# Patient Record
Sex: Male | Born: 1950 | Race: White | Hispanic: No | Marital: Married | Smoking: Former smoker
Health system: Southern US, Community
[De-identification: ages and names within clinical notes are randomized; demographics above are authoritative.]

## PROBLEM LIST (undated history)

## (undated) DIAGNOSIS — G4730 Sleep apnea, unspecified: Secondary | ICD-10-CM

## (undated) DIAGNOSIS — K8020 Calculus of gallbladder without cholecystitis without obstruction: Secondary | ICD-10-CM

## (undated) DIAGNOSIS — E785 Hyperlipidemia, unspecified: Secondary | ICD-10-CM

## (undated) DIAGNOSIS — R748 Abnormal levels of other serum enzymes: Secondary | ICD-10-CM

## (undated) DIAGNOSIS — E119 Type 2 diabetes mellitus without complications: Secondary | ICD-10-CM

## (undated) DIAGNOSIS — I10 Essential (primary) hypertension: Secondary | ICD-10-CM

## (undated) DIAGNOSIS — I2510 Atherosclerotic heart disease of native coronary artery without angina pectoris: Secondary | ICD-10-CM

## (undated) DIAGNOSIS — K219 Gastro-esophageal reflux disease without esophagitis: Secondary | ICD-10-CM

## (undated) HISTORY — DX: Abnormal levels of other serum enzymes: R74.8

## (undated) HISTORY — DX: Type 2 diabetes mellitus without complications: E11.9

## (undated) HISTORY — DX: Sleep apnea, unspecified: G47.30

## (undated) HISTORY — DX: Atherosclerotic heart disease of native coronary artery without angina pectoris: I25.10

## (undated) HISTORY — DX: Gastro-esophageal reflux disease without esophagitis: K21.9

## (undated) HISTORY — DX: Essential (primary) hypertension: I10

## (undated) HISTORY — DX: Hyperlipidemia, unspecified: E78.5

## (undated) HISTORY — PX: HAND SURGERY: SHX662

## (undated) HISTORY — DX: Calculus of gallbladder without cholecystitis without obstruction: K80.20

---

## 2014-06-22 ENCOUNTER — Ambulatory Visit: Payer: Self-pay | Admitting: Gastroenterology

## 2014-09-27 LAB — SURGICAL PATHOLOGY

## 2014-11-29 ENCOUNTER — Ambulatory Visit (INDEPENDENT_AMBULATORY_CARE_PROVIDER_SITE_OTHER): Payer: 59 | Admitting: Family Medicine

## 2014-11-29 ENCOUNTER — Encounter: Payer: Self-pay | Admitting: Family Medicine

## 2014-11-29 VITALS — BP 127/72 | HR 57 | Temp 98.5°F | Ht 73.25 in | Wt 237.0 lb

## 2014-11-29 DIAGNOSIS — E785 Hyperlipidemia, unspecified: Secondary | ICD-10-CM | POA: Insufficient documentation

## 2014-11-29 DIAGNOSIS — R358 Other polyuria: Secondary | ICD-10-CM | POA: Diagnosis not present

## 2014-11-29 DIAGNOSIS — G4762 Sleep related leg cramps: Secondary | ICD-10-CM

## 2014-11-29 DIAGNOSIS — I2510 Atherosclerotic heart disease of native coronary artery without angina pectoris: Secondary | ICD-10-CM | POA: Insufficient documentation

## 2014-11-29 DIAGNOSIS — E119 Type 2 diabetes mellitus without complications: Secondary | ICD-10-CM | POA: Insufficient documentation

## 2014-11-29 DIAGNOSIS — I10 Essential (primary) hypertension: Secondary | ICD-10-CM | POA: Insufficient documentation

## 2014-11-29 DIAGNOSIS — R350 Frequency of micturition: Secondary | ICD-10-CM | POA: Diagnosis not present

## 2014-11-29 LAB — URINALYSIS, ROUTINE W REFLEX MICROSCOPIC
Bilirubin, UA: NEGATIVE
Glucose, UA: NEGATIVE
Ketones, UA: NEGATIVE
Leukocytes, UA: NEGATIVE
Nitrite, UA: NEGATIVE
Protein, UA: NEGATIVE
RBC, UA: NEGATIVE
Specific Gravity, UA: 1.01 (ref 1.005–1.030)
Urobilinogen, Ur: 0.2 mg/dL (ref 0.2–1.0)
pH, UA: 6 (ref 5.0–7.5)

## 2014-11-29 NOTE — Progress Notes (Signed)
   BP 127/72 mmHg  Pulse 57  Temp(Src) 98.5 F (36.9 C)  Ht 6' 1.25" (1.861 m)  Wt 237 lb (107.502 kg)  BMI 31.04 kg/m2  SpO2 99%   Subjective:    Patient ID: Alejandro Ryan, male    DOB: 1950/10/30, 64 y.o.   MRN: [id number]  HPI: Alejandro Ryan is a 64 y.o. male  Chief Complaint  Patient presents with  . Urinary Frequency  . leg cramps  large amounts 2 x an hr or so Does not seem to be drinking more Leg cramps off and on but worse over the last weeks During sleep study had some restless legs Cramps 2-3 times at night wake up from sleep  Was on vacation in [place] last mo and given steriod shots and indocin for back muscle strain  Relevant past medical, surgical, family and social history reviewed and updated as indicated. Interim medical history since our last visit reviewed. Allergies and medications reviewed and updated.  Review of Systems  Constitutional: Negative.   Respiratory: Negative.   Cardiovascular: Negative.     Per HPI unless specifically indicated above     Objective:    BP 127/72 mmHg  Pulse 57  Temp(Src) 98.5 F (36.9 C)  Ht 6' 1.25" (1.861 m)  Wt 237 lb (107.502 kg)  BMI 31.04 kg/m2  SpO2 99%  Wt Readings from Last 3 Encounters:  11/29/14 237 lb (107.502 kg)  10/13/14 239 lb (108.41 kg)    Physical Exam  Constitutional: He is oriented to person, place, and time. He appears well-developed and well-nourished. No distress.  HENT:  Head: Normocephalic and atraumatic.  Right Ear: Hearing normal.  Left Ear: Hearing normal.  Nose: Nose normal.  Eyes: Conjunctivae and lids are normal. Right eye exhibits no discharge. Left eye exhibits no discharge. No scleral icterus.  Cardiovascular: Normal rate, regular rhythm and normal heart sounds.   Pulmonary/Chest: Effort normal and breath sounds normal. No respiratory distress.  Musculoskeletal: Normal range of motion.  Legs normal exam  Neurological: He is alert and oriented to person, place, and time.   Skin: Skin is intact. No rash noted.  Psychiatric: He has a normal mood and affect. His speech is normal and behavior is normal. Judgment and thought content normal. Cognition and memory are normal.        Assessment & Plan:   Problem List Items Addressed This Visit    None    Visit Diagnoses    Nocturnal leg cramps    -  Primary    Relevant Orders    CBC with Differential/Platelet    Vitamin B12    Urinalysis, Routine w reflex microscopic ([facility])    Basic metabolic panel    Ferritin    Frequency of urination and polyuria        for legs and frequency will check BMP, ferritin, CBC, B12,  U/A    Relevant Orders    CBC with Differential/Platelet    Vitamin B12    Urinalysis, Routine w reflex microscopic (not [facility])    Basic metabolic panel    Ferritin        Follow up plan: Return if symptoms worsen or fail to improve, for pending results.

## 2014-11-30 LAB — CBC WITH DIFFERENTIAL/PLATELET
BASOS ABS: 0 10*3/uL (ref 0.0–0.2)
BASOS: 0 %
EOS (ABSOLUTE): 0.1 10*3/uL (ref 0.0–0.4)
EOS: 2 %
HEMATOCRIT: 39.6 % (ref 37.5–51.0)
HEMOGLOBIN: 12.9 g/dL (ref 12.6–17.7)
Immature Grans (Abs): 0 10*3/uL (ref 0.0–0.1)
Immature Granulocytes: 0 %
LYMPHS: 40 %
Lymphocytes Absolute: 1.8 10*3/uL (ref 0.7–3.1)
MCH: 30.6 pg (ref 26.6–33.0)
MCHC: 32.6 g/dL (ref 31.5–35.7)
MCV: 94 fL (ref 79–97)
Monocytes Absolute: 0.6 10*3/uL (ref 0.1–0.9)
Monocytes: 13 %
NEUTROS ABS: 2 10*3/uL (ref 1.4–7.0)
NEUTROS PCT: 45 %
Platelets: 196 10*3/uL (ref 150–379)
RBC: 4.22 x10E6/uL (ref 4.14–5.80)
RDW: 15.2 % (ref 12.3–15.4)
WBC: 4.5 10*3/uL (ref 3.4–10.8)

## 2014-11-30 LAB — BASIC METABOLIC PANEL
BUN/Creatinine Ratio: 21 (ref 10–22)
BUN: 15 mg/dL (ref 8–27)
CHLORIDE: 99 mmol/L (ref 97–108)
CO2: 26 mmol/L (ref 18–29)
Calcium: 9.3 mg/dL (ref 8.6–10.2)
Creatinine, Ser: 0.71 mg/dL — ABNORMAL LOW (ref 0.76–1.27)
GFR calc Af Amer: 117 mL/min/{1.73_m2} (ref >59)
GFR, EST NON AFRICAN AMERICAN: 101 mL/min/{1.73_m2} (ref >59)
GLUCOSE: 92 mg/dL (ref 65–99)
POTASSIUM: 4.8 mmol/L (ref 3.5–5.2)
Sodium: 140 mmol/L (ref 134–144)

## 2014-11-30 LAB — FERRITIN: Ferritin: 573 ng/mL — ABNORMAL HIGH (ref 30–400)

## 2014-11-30 LAB — VITAMIN B12

## 2014-12-01 ENCOUNTER — Telehealth: Payer: Self-pay | Admitting: Family Medicine

## 2014-12-01 DIAGNOSIS — I6529 Occlusion and stenosis of unspecified carotid artery: Secondary | ICD-10-CM

## 2014-12-01 NOTE — Telephone Encounter (Signed)
-----   Message from Lurlean HornsNancy H Wilson, CMA sent at 12/01/2014  5:05 PM EDT ----- Herby AbrahamXray results too

## 2014-12-01 NOTE — Telephone Encounter (Signed)
Cramps better with gateraid Had Panorex by dentist yesterday and calcium seen in carotid arteries Pt no sx  Concerned about possible Carotid art disease  Will make apt at [facility] to review and further check if needed.

## 2015-02-17 ENCOUNTER — Other Ambulatory Visit: Payer: Self-pay | Admitting: Family Medicine

## 2015-04-08 ENCOUNTER — Other Ambulatory Visit: Payer: Self-pay | Admitting: Family Medicine

## 2015-04-18 ENCOUNTER — Encounter: Payer: Self-pay | Admitting: Family Medicine

## 2015-04-18 ENCOUNTER — Ambulatory Visit (INDEPENDENT_AMBULATORY_CARE_PROVIDER_SITE_OTHER): Payer: 59 | Admitting: Family Medicine

## 2015-04-18 VITALS — BP 138/82 | HR 56 | Temp 98.7°F | Ht 72.5 in | Wt 244.0 lb

## 2015-04-18 DIAGNOSIS — I2510 Atherosclerotic heart disease of native coronary artery without angina pectoris: Secondary | ICD-10-CM | POA: Diagnosis not present

## 2015-04-18 DIAGNOSIS — E785 Hyperlipidemia, unspecified: Secondary | ICD-10-CM | POA: Diagnosis not present

## 2015-04-18 DIAGNOSIS — Z0000 Encounter for general adult medical examination without abnormal findings: Secondary | ICD-10-CM | POA: Diagnosis not present

## 2015-04-18 DIAGNOSIS — I10 Essential (primary) hypertension: Secondary | ICD-10-CM | POA: Diagnosis not present

## 2015-04-18 DIAGNOSIS — G4730 Sleep apnea, unspecified: Secondary | ICD-10-CM | POA: Insufficient documentation

## 2015-04-18 DIAGNOSIS — E119 Type 2 diabetes mellitus without complications: Secondary | ICD-10-CM | POA: Diagnosis not present

## 2015-04-18 LAB — MICROALBUMIN, URINE WAIVED
Creatinine, Urine Waived: 100 mg/dL (ref 10–300)
Microalb, Ur Waived: 10 mg/L (ref 0–19)

## 2015-04-18 LAB — URINALYSIS, ROUTINE W REFLEX MICROSCOPIC
Bilirubin, UA: NEGATIVE
Glucose, UA: NEGATIVE
Ketones, UA: NEGATIVE
LEUKOCYTES UA: NEGATIVE
NITRITE UA: NEGATIVE
PH UA: 6 (ref 5.0–7.5)
PROTEIN UA: NEGATIVE
RBC UA: NEGATIVE
SPEC GRAV UA: 1.02 (ref 1.005–1.030)
Urobilinogen, Ur: 0.2 mg/dL (ref 0.2–1.0)

## 2015-04-18 LAB — BAYER DCA HB A1C WAIVED: HB A1C: 6 % (ref <7.0)

## 2015-04-18 MED ORDER — LOVASTATIN 40 MG PO TABS: 40.0000 mg | ORAL_TABLET | Freq: Every day | ORAL | Status: DC

## 2015-04-18 MED ORDER — OLMESARTAN-AMLODIPINE-HCTZ 40-10-25 MG PO TABS: 1.0000 | ORAL_TABLET | Freq: Every day | ORAL | Status: DC

## 2015-04-18 MED ORDER — METFORMIN HCL 500 MG PO TABS: 1000.0000 mg | ORAL_TABLET | Freq: Every day | ORAL | Status: DC

## 2015-04-18 MED ORDER — METOPROLOL SUCCINATE ER 50 MG PO TB24: 50.0000 mg | ORAL_TABLET | Freq: Every day | ORAL | Status: DC

## 2015-04-18 MED ORDER — CLONIDINE HCL 0.1 MG PO TABS
0.1000 mg | ORAL_TABLET | Freq: Two times a day (BID) | ORAL | Status: DC
Start: 2015-04-18 — End: 2016-04-23

## 2015-04-18 NOTE — Progress Notes (Signed)
BP 138/82 mmHg  Pulse 56  Temp(Src) 98.7 F (37.1 C)  Ht 6' 0.5" (1.842 m)  Wt 244 lb (110.678 kg)  BMI 32.62 kg/m2  SpO2 98%   Subjective:    Patient ID: Alejandro Ryan, male    DOB: 01-09-1951, 64 y.o.   MRN: [id number]  HPI: Alejandro Ryan is a 64 y.o. male  Chief Complaint  Patient presents with  . Annual Exam   patient doing well with blood pressure medication no side effects takes faithfully Blood sugar doing well with no low blood sugar spells Has gained 5 pounds over this last 6 months Cholesterol doing well no complaints No chest pain no  chest tightness  Relevant past medical, surgical, family and social history reviewed and updated as indicated. Interim medical history since our last visit reviewed. Allergies and medications reviewed and updated.  Review of Systems  Constitutional: Negative.   HENT: Negative.   Eyes: Negative.   Respiratory: Negative.   Cardiovascular: Negative.   Gastrointestinal: Negative.   Endocrine: Negative.   Genitourinary: Negative.   Musculoskeletal: Negative.   Skin: Negative.   Allergic/Immunologic: Negative.   Neurological: Negative.   Hematological: Negative.   Psychiatric/Behavioral: Negative.     Per HPI unless specifically indicated above     Objective:    BP 138/82 mmHg  Pulse 56  Temp(Src) 98.7 F (37.1 C)  Ht 6' 0.5" (1.842 m)  Wt 244 lb (110.678 kg)  BMI 32.62 kg/m2  SpO2 98%  Wt Readings from Last 3 Encounters:  04/18/15 244 lb (110.678 kg)  11/29/14 237 lb (107.502 kg)  10/13/14 239 lb (108.41 kg)    Physical Exam  Constitutional: He is oriented to person, place, and time. He appears well-developed and well-nourished.  HENT:  Head: Normocephalic and atraumatic.  Right Ear: External ear normal.  Left Ear: External ear normal.  Eyes: Conjunctivae and EOM are normal. Pupils are equal, round, and reactive to light.  Neck: Normal range of motion. Neck supple.  Cardiovascular: Normal rate, regular  rhythm, normal heart sounds and intact distal pulses.   Pulmonary/Chest: Effort normal and breath sounds normal.  Abdominal: Soft. Bowel sounds are normal. There is no splenomegaly or hepatomegaly.  Genitourinary: Rectum normal, prostate normal and penis normal.  Musculoskeletal: Normal range of motion.  Neurological: He is alert and oriented to person, place, and time. He has normal reflexes.  Skin: No rash noted. No erythema.  Psychiatric: He has a normal mood and affect. His behavior is normal. Judgment and thought content normal.    Results for orders placed or performed in visit on 11/29/14  CBC with Differential/Platelet  Result Value Ref Range   WBC 4.5 3.4 - 10.8 x10E3/uL   RBC 4.22 4.14 - 5.80 x10E6/uL   Hemoglobin 12.9 12.6 - 17.7 g/dL   Hematocrit 40.9 81.1 - 51.0 %   MCV 94 79 - 97 fL   MCH 30.6 26.6 - 33.0 pg   MCHC 32.6 31.5 - 35.7 g/dL   RDW 91.4 78.2 - 95.6 %   Platelets 196 150 - 379 x10E3/uL   Neutrophils 45 %   Lymphs 40 %   Monocytes 13 %   Eos 2 %   Basos 0 %   Neutrophils Absolute 2.0 1.4 - 7.0 x10E3/uL   Lymphocytes Absolute 1.8 0.7 - 3.1 x10E3/uL   Monocytes Absolute 0.6 0.1 - 0.9 x10E3/uL   EOS (ABSOLUTE) 0.1 0.0 - 0.4 x10E3/uL   Basophils Absolute 0.0 0.0 - 0.2 x10E3/uL  Immature Granulocytes 0 %   Immature Grans (Abs) 0.0 0.0 - 0.1 x10E3/uL  Vitamin B12  Result Value Ref Range   Vitamin B-12 >2000 (H) 211 - 946 pg/mL  Urinalysis, Routine w reflex microscopic (not at [facility])  Result Value Ref Range   Specific Gravity, UA 1.010 1.005 - 1.030   pH, UA 6.0 5.0 - 7.5   Color, UA Yellow Yellow   Appearance Ur Clear Clear   Leukocytes, UA Negative Negative   Protein, UA Negative Negative/Trace   Glucose, UA Negative Negative   Ketones, UA Negative Negative   RBC, UA Negative Negative   Bilirubin, UA Negative Negative   Urobilinogen, Ur 0.2 0.2 - 1.0 mg/dL   Nitrite, UA Negative Negative  Ferritin  Result Value Ref Range   Ferritin 573 (H) 30 -  400 ng/mL  Basic metabolic panel  Result Value Ref Range   Glucose 92 65 - 99 mg/dL   BUN 15 8 - 27 mg/dL   Creatinine, Ser 1.61 (L) 0.76 - 1.27 mg/dL   GFR calc non Af Amer 101 >59 mL/min/1.73   GFR calc Af Amer 117 >59 mL/min/1.73   BUN/Creatinine Ratio 21 10 - 22   Sodium 140 134 - 144 mmol/L   Potassium 4.8 3.5 - 5.2 mmol/L   Chloride 99 97 - 108 mmol/L   CO2 26 18 - 29 mmol/L   Calcium 9.3 8.6 - 10.2 mg/dL      Assessment & Plan:   Problem List Items Addressed This Visit      Cardiovascular and Mediastinum   Hypertension    The current medical regimen is effective;  continue present plan and medications.       Relevant Medications   cloNIDine (CATAPRES) 0.1 MG tablet   lovastatin (MEVACOR) 40 MG tablet   Olmesartan-Amlodipine-HCTZ (TRIBENZOR) 40-10-25 MG TABS   metoprolol succinate (TOPROL-XL) 50 MG 24 hr tablet   CAD (coronary artery disease)    The current medical regimen is effective;  continue present plan and medications.       Relevant Medications   cloNIDine (CATAPRES) 0.1 MG tablet   lovastatin (MEVACOR) 40 MG tablet   Olmesartan-Amlodipine-HCTZ (TRIBENZOR) 40-10-25 MG TABS   metoprolol succinate (TOPROL-XL) 50 MG 24 hr tablet     Endocrine   Diabetes mellitus without complication (HCC)    The current medical regimen is effective;  continue present plan and medications. Discussed need better control with diet      Relevant Medications   lovastatin (MEVACOR) 40 MG tablet   metFORMIN (GLUCOPHAGE) 500 MG tablet   Olmesartan-Amlodipine-HCTZ (TRIBENZOR) 40-10-25 MG TABS   Other Relevant Orders   Microalbumin, Urine Waived   Bayer DCA Hb A1c Waived     Other   Sleep apnea    Using CPAP      Hyperlipidemia    The current medical regimen is effective;  continue present plan and medications.       Relevant Medications   cloNIDine (CATAPRES) 0.1 MG tablet   lovastatin (MEVACOR) 40 MG tablet   Olmesartan-Amlodipine-HCTZ (TRIBENZOR) 40-10-25 MG  TABS   metoprolol succinate (TOPROL-XL) 50 MG 24 hr tablet    Other Visit Diagnoses    Routine general medical examination at a health care facility    -  Primary    Relevant Orders    CBC with Differential/Platelet    Comprehensive metabolic panel    Lipid Panel w/o Chol/HDL Ratio    PSA    TSH    Urinalysis,  Routine w reflex microscopic (not [facility])        Follow up plan: Return in about 3 months (around 07/19/2015), or if symptoms worsen or fail to improve, for a1c.

## 2015-04-18 NOTE — Assessment & Plan Note (Signed)
The current medical regimen is effective;  continue present plan and medications.  

## 2015-04-18 NOTE — Assessment & Plan Note (Signed)
Using CPAP 

## 2015-04-18 NOTE — Assessment & Plan Note (Signed)
The current medical regimen is effective;  continue present plan and medications. Discussed need better control with diet

## 2015-04-19 ENCOUNTER — Encounter: Payer: Self-pay | Admitting: Family Medicine

## 2015-04-19 LAB — COMPREHENSIVE METABOLIC PANEL
ALK PHOS: 43 IU/L (ref 39–117)
ALT: 25 IU/L (ref 0–44)
AST: 21 IU/L (ref 0–40)
Albumin/Globulin Ratio: 1.6 (ref 1.1–2.5)
Albumin: 4.3 g/dL (ref 3.6–4.8)
BUN/Creatinine Ratio: 19 (ref 10–22)
BUN: 17 mg/dL (ref 8–27)
Bilirubin Total: 0.2 mg/dL (ref 0.0–1.2)
CHLORIDE: 102 mmol/L (ref 97–106)
CO2: 24 mmol/L (ref 18–29)
CREATININE: 0.9 mg/dL (ref 0.76–1.27)
Calcium: 9.5 mg/dL (ref 8.6–10.2)
GFR calc Af Amer: 105 mL/min/{1.73_m2} (ref >59)
GFR calc non Af Amer: 91 mL/min/{1.73_m2} (ref >59)
GLUCOSE: 106 mg/dL — AB (ref 65–99)
Globulin, Total: 2.7 g/dL (ref 1.5–4.5)
Potassium: 4.7 mmol/L (ref 3.5–5.2)
SODIUM: 141 mmol/L (ref 136–144)
Total Protein: 7 g/dL (ref 6.0–8.5)

## 2015-04-19 LAB — LIPID PANEL W/O CHOL/HDL RATIO
CHOLESTEROL TOTAL: 157 mg/dL (ref 100–199)
HDL: 50 mg/dL (ref >39)
LDL Calculated: 75 mg/dL (ref 0–99)
Triglycerides: 160 mg/dL — ABNORMAL HIGH (ref 0–149)
VLDL CHOLESTEROL CAL: 32 mg/dL (ref 5–40)

## 2015-04-19 LAB — CBC WITH DIFFERENTIAL/PLATELET
BASOS ABS: 0 10*3/uL (ref 0.0–0.2)
Basos: 1 %
EOS (ABSOLUTE): 0.1 10*3/uL (ref 0.0–0.4)
Eos: 3 %
Hematocrit: 38.6 % (ref 37.5–51.0)
Hemoglobin: 12.6 g/dL (ref 12.6–17.7)
Immature Grans (Abs): 0 10*3/uL (ref 0.0–0.1)
Immature Granulocytes: 0 %
LYMPHS ABS: 2.1 10*3/uL (ref 0.7–3.1)
Lymphs: 50 %
MCH: 30.8 pg (ref 26.6–33.0)
MCHC: 32.6 g/dL (ref 31.5–35.7)
MCV: 94 fL (ref 79–97)
MONOS ABS: 0.4 10*3/uL (ref 0.1–0.9)
Monocytes: 9 %
Neutrophils Absolute: 1.6 10*3/uL (ref 1.4–7.0)
Neutrophils: 37 %
Platelets: 203 10*3/uL (ref 150–379)
RBC: 4.09 x10E6/uL — AB (ref 4.14–5.80)
RDW: 15.1 % (ref 12.3–15.4)
WBC: 4.3 10*3/uL (ref 3.4–10.8)

## 2015-04-19 LAB — PSA: Prostate Specific Ag, Serum: 1.9 ng/mL (ref 0.0–4.0)

## 2015-04-19 LAB — TSH: TSH: 1.38 u[IU]/mL (ref 0.450–4.500)

## 2015-04-21 ENCOUNTER — Other Ambulatory Visit: Payer: Self-pay | Admitting: Family Medicine

## 2015-04-21 MED ORDER — METFORMIN HCL ER 500 MG PO TB24: 1000.0000 mg | ORAL_TABLET | Freq: Two times a day (BID) | ORAL | Status: DC

## 2015-07-12 ENCOUNTER — Telehealth: Payer: Self-pay

## 2015-07-12 NOTE — Telephone Encounter (Signed)
Requesting new Rx for  Viagra Tab  [facility]

## 2015-07-13 MED ORDER — SILDENAFIL CITRATE 100 MG PO TABS: 100.0000 mg | ORAL_TABLET | Freq: Every day | ORAL | Status: DC | PRN

## 2015-07-21 ENCOUNTER — Ambulatory Visit: Payer: 59 | Admitting: Family Medicine

## 2015-07-27 ENCOUNTER — Encounter: Payer: Self-pay | Admitting: Family Medicine

## 2015-07-27 ENCOUNTER — Ambulatory Visit (INDEPENDENT_AMBULATORY_CARE_PROVIDER_SITE_OTHER): Payer: 59 | Admitting: Family Medicine

## 2015-07-27 VITALS — BP 129/72 | HR 59 | Temp 98.7°F | Ht 72.5 in | Wt 245.0 lb

## 2015-07-27 DIAGNOSIS — E785 Hyperlipidemia, unspecified: Secondary | ICD-10-CM | POA: Diagnosis not present

## 2015-07-27 DIAGNOSIS — I10 Essential (primary) hypertension: Secondary | ICD-10-CM | POA: Diagnosis not present

## 2015-07-27 DIAGNOSIS — E119 Type 2 diabetes mellitus without complications: Secondary | ICD-10-CM

## 2015-07-27 LAB — BAYER DCA HB A1C WAIVED: HB A1C (BAYER DCA - WAIVED): 6.1 % (ref <7.0)

## 2015-07-27 NOTE — Progress Notes (Signed)
BP 129/72 mmHg  Pulse 59  Temp(Src) 98.7 F (37.1 C)  Ht 6' 0.5" (1.842 m)  Wt 245 lb (111.131 kg)  BMI 32.75 kg/m2  SpO2 97%   Subjective:    Patient ID: Alejandro Ryan, male    DOB: 1951-04-22, 65 y.o.   MRN: [id number]  HPI: Alejandro Ryan is a 65 y.o. male  Chief Complaint  Patient presents with  . Diabetes   doing well with diabetes taking metformin 502 tablets at night without problems noted low blood sugar spells no issues Blood pressure doing well and checked at home every now and then is doing well No issues with cholesterol medicines Takes medications faithfully without side effects  Relevant past medical, surgical, family and social history reviewed and updated as indicated. Interim medical history since our last visit reviewed. Allergies and medications reviewed and updated.  Review of Systems  Constitutional: Negative.   Respiratory: Negative.   Cardiovascular: Negative.     Per HPI unless specifically indicated above     Objective:    BP 129/72 mmHg  Pulse 59  Temp(Src) 98.7 F (37.1 C)  Ht 6' 0.5" (1.842 m)  Wt 245 lb (111.131 kg)  BMI 32.75 kg/m2  SpO2 97%  Wt Readings from Last 3 Encounters:  07/27/15 245 lb (111.131 kg)  04/18/15 244 lb (110.678 kg)  11/29/14 237 lb (107.502 kg)    Physical Exam  Constitutional: He is oriented to person, place, and time. He appears well-developed and well-nourished. No distress.  HENT:  Head: Normocephalic and atraumatic.  Right Ear: Hearing normal.  Left Ear: Hearing normal.  Nose: Nose normal.  Eyes: Conjunctivae and lids are normal. Right eye exhibits no discharge. Left eye exhibits no discharge. No scleral icterus.  Cardiovascular: Normal rate, regular rhythm and normal heart sounds.   Pulmonary/Chest: Effort normal and breath sounds normal. No respiratory distress.  Musculoskeletal: Normal range of motion.  Neurological: He is alert and oriented to person, place, and time.  Skin: Skin is intact. No  rash noted.  Psychiatric: He has a normal mood and affect. His speech is normal and behavior is normal. Judgment and thought content normal. Cognition and memory are normal.    Results for orders placed or performed in visit on 04/18/15  CBC with Differential/Platelet  Result Value Ref Range   WBC 4.3 3.4 - 10.8 x10E3/uL   RBC 4.09 (L) 4.14 - 5.80 x10E6/uL   Hemoglobin 12.6 12.6 - 17.7 g/dL   Hematocrit 08.6 57.8 - 51.0 %   MCV 94 79 - 97 fL   MCH 30.8 26.6 - 33.0 pg   MCHC 32.6 31.5 - 35.7 g/dL   RDW 46.9 62.9 - 52.8 %   Platelets 203 150 - 379 x10E3/uL   Neutrophils 37 %   Lymphs 50 %   Monocytes 9 %   Eos 3 %   Basos 1 %   Neutrophils Absolute 1.6 1.4 - 7.0 x10E3/uL   Lymphocytes Absolute 2.1 0.7 - 3.1 x10E3/uL   Monocytes Absolute 0.4 0.1 - 0.9 x10E3/uL   EOS (ABSOLUTE) 0.1 0.0 - 0.4 x10E3/uL   Basophils Absolute 0.0 0.0 - 0.2 x10E3/uL   Immature Granulocytes 0 %   Immature Grans (Abs) 0.0 0.0 - 0.1 x10E3/uL  Comprehensive metabolic panel  Result Value Ref Range   Glucose 106 (H) 65 - 99 mg/dL   BUN 17 8 - 27 mg/dL   Creatinine, Ser 4.13 0.76 - 1.27 mg/dL   GFR calc non Af Amer 91 >  59 mL/min/1.73   GFR calc Af Amer 105 >59 mL/min/1.73   BUN/Creatinine Ratio 19 10 - 22   Sodium 141 136 - 144 mmol/L   Potassium 4.7 3.5 - 5.2 mmol/L   Chloride 102 97 - 106 mmol/L   CO2 24 18 - 29 mmol/L   Calcium 9.5 8.6 - 10.2 mg/dL   Total Protein 7.0 6.0 - 8.5 g/dL   Albumin 4.3 3.6 - 4.8 g/dL   Globulin, Total 2.7 1.5 - 4.5 g/dL   Albumin/Globulin Ratio 1.6 1.1 - 2.5   Bilirubin Total 0.2 0.0 - 1.2 mg/dL   Alkaline Phosphatase 43 39 - 117 IU/L   AST 21 0 - 40 IU/L   ALT 25 0 - 44 IU/L  Lipid Panel w/o Chol/HDL Ratio  Result Value Ref Range   Cholesterol, Total 157 100 - 199 mg/dL   Triglycerides 782 (H) 0 - 149 mg/dL   HDL 50 >95 mg/dL   VLDL Cholesterol Cal 32 5 - 40 mg/dL   LDL Calculated 75 0 - 99 mg/dL  PSA  Result Value Ref Range   Prostate Specific Ag, Serum 1.9  0.0 - 4.0 ng/mL  TSH  Result Value Ref Range   TSH 1.380 0.450 - 4.500 uIU/mL  Urinalysis, Routine w reflex microscopic (not at [facility])  Result Value Ref Range   Specific Gravity, UA 1.020 1.005 - 1.030   pH, UA 6.0 5.0 - 7.5   Color, UA Yellow Yellow   Appearance Ur Clear Clear   Leukocytes, UA Negative Negative   Protein, UA Negative Negative/Trace   Glucose, UA Negative Negative   Ketones, UA Negative Negative   RBC, UA Negative Negative   Bilirubin, UA Negative Negative   Urobilinogen, Ur 0.2 0.2 - 1.0 mg/dL   Nitrite, UA Negative Negative  Microalbumin, Urine Waived  Result Value Ref Range   Microalb, Ur Waived 10 0 - 19 mg/L   Creatinine, Urine Waived 100 10 - 300 mg/dL   Microalb/Creat Ratio <30 <30 mg/g  Bayer DCA Hb A1c Waived  Result Value Ref Range   Bayer DCA Hb A1c Waived 6.0 <7.0 %      Assessment & Plan:   Problem List Items Addressed This Visit      Cardiovascular and Mediastinum   Hypertension    The current medical regimen is effective;  continue present plan and medications.         Endocrine   Diabetes mellitus without complication (HCC) - Primary    The current medical regimen is effective;  continue present plan and medications.       Relevant Orders   Bayer DCA Hb A1c Waived     Other   Hyperlipidemia    The current medical regimen is effective;  continue present plan and medications.           Follow up plan: Return in about 3 months (around 10/24/2015) for Med check with BMP, lipids, ALT, AST, A1c.

## 2015-07-27 NOTE — Assessment & Plan Note (Signed)
The current medical regimen is effective;  continue present plan and medications.  

## 2015-08-17 LAB — HM DIABETES EYE EXAM

## 2015-11-01 ENCOUNTER — Ambulatory Visit (INDEPENDENT_AMBULATORY_CARE_PROVIDER_SITE_OTHER): Payer: 59 | Admitting: Family Medicine

## 2015-11-01 ENCOUNTER — Encounter: Payer: Self-pay | Admitting: Family Medicine

## 2015-11-01 VITALS — BP 137/75 | HR 64 | Temp 98.2°F | Ht 72.5 in | Wt 245.0 lb

## 2015-11-01 DIAGNOSIS — I10 Essential (primary) hypertension: Secondary | ICD-10-CM | POA: Diagnosis not present

## 2015-11-01 DIAGNOSIS — G4730 Sleep apnea, unspecified: Secondary | ICD-10-CM

## 2015-11-01 DIAGNOSIS — I2510 Atherosclerotic heart disease of native coronary artery without angina pectoris: Secondary | ICD-10-CM

## 2015-11-01 DIAGNOSIS — E785 Hyperlipidemia, unspecified: Secondary | ICD-10-CM

## 2015-11-01 DIAGNOSIS — E119 Type 2 diabetes mellitus without complications: Secondary | ICD-10-CM | POA: Diagnosis not present

## 2015-11-01 LAB — LP+ALT+AST PICCOLO, WAIVED
ALT (SGPT) Piccolo, Waived: 30 U/L (ref 10–47)
AST (SGOT) Piccolo, Waived: 34 U/L (ref 11–38)
CHOL/HDL RATIO PICCOLO,WAIVE: 2.8 mg/dL
CHOLESTEROL PICCOLO, WAIVED: 148 mg/dL (ref <200)
HDL CHOL PICCOLO, WAIVED: 52 mg/dL — AB (ref >59)
LDL CHOL CALC PICCOLO WAIVED: 66 mg/dL (ref <100)
Triglycerides Piccolo,Waived: 151 mg/dL — ABNORMAL HIGH (ref <150)
VLDL Chol Calc Piccolo,Waive: 30 mg/dL — ABNORMAL HIGH (ref <30)

## 2015-11-01 LAB — BAYER DCA HB A1C WAIVED: HB A1C (BAYER DCA - WAIVED): 6.4 % (ref <7.0)

## 2015-11-01 NOTE — Progress Notes (Signed)
BP 137/75 mmHg  Pulse 64  Temp(Src) 98.2 F (36.8 C)  Ht 6' 0.5" (1.842 m)  Wt 245 lb (111.131 kg)  BMI 32.75 kg/m2  SpO2 98%   Subjective:    Patient ID: Alejandro Ryan, male    DOB: 08-16-1950, 65 y.o.   MRN: [id number]  HPI: Alejandro Ryan is a 65 y.o. male  Chief Complaint  Patient presents with  . Hypertension  . Hyperlipidemia  . Diabetes  . Coronary Artery Disease  Recheck doing well no complaints from medications noted low blood sugar spells takes medications faithfully without problems. Blood pressure good control no chest pain chest tightness for coronary artery symptoms. uses CPAP faithfully   Relevant past medical, surgical, family and social history reviewed and updated as indicated. Interim medical history since our last visit reviewed. Allergies and medications reviewed and updated.  Review of Systems  Constitutional: Negative.   Respiratory: Negative.   Cardiovascular: Negative.     Per HPI unless specifically indicated above     Objective:    BP 137/75 mmHg  Pulse 64  Temp(Src) 98.2 F (36.8 C)  Ht 6' 0.5" (1.842 m)  Wt 245 lb (111.131 kg)  BMI 32.75 kg/m2  SpO2 98%  Wt Readings from Last 3 Encounters:  11/01/15 245 lb (111.131 kg)  07/27/15 245 lb (111.131 kg)  04/18/15 244 lb (110.678 kg)    Physical Exam  Constitutional: He is oriented to person, place, and time. He appears well-developed and well-nourished. No distress.  HENT:  Head: Normocephalic and atraumatic.  Right Ear: Hearing normal.  Left Ear: Hearing normal.  Nose: Nose normal.  Eyes: Conjunctivae and lids are normal. Right eye exhibits no discharge. Left eye exhibits no discharge. No scleral icterus.  Cardiovascular: Normal rate, regular rhythm and normal heart sounds.   Pulmonary/Chest: Effort normal and breath sounds normal. No respiratory distress.  Musculoskeletal: Normal range of motion.  Neurological: He is alert and oriented to person, place, and time.  Skin: Skin  is intact. No rash noted.  Psychiatric: He has a normal mood and affect. His speech is normal and behavior is normal. Judgment and thought content normal. Cognition and memory are normal.    Results for orders placed or performed in visit on 08/18/15  HM DIABETES EYE EXAM  Result Value Ref Range   HM Diabetic Eye Exam No Retinopathy No Retinopathy      Assessment & Plan:   Problem List Items Addressed This Visit      Cardiovascular and Mediastinum   CAD (coronary artery disease)    The current medical regimen is effective;  continue present plan and medications.       Hypertension    The current medical regimen is effective;  continue present plan and medications.       Relevant Orders   Basic metabolic panel   LP+ALT+AST Piccolo, Waived   Bayer DCA Hb A1c Waived     Endocrine   Diabetes mellitus without complication (HCC) - Primary    The current medical regimen is effective;  continue present plan and medications.       Relevant Orders   Basic metabolic panel   LP+ALT+AST Piccolo, Waived   Bayer DCA Hb A1c Waived     Other   Hyperlipidemia    The current medical regimen is effective;  continue present plan and medications.       Relevant Orders   Basic metabolic panel   LP+ALT+AST Piccolo, Waived   Bayer DCA Hb  A1c Waived   Sleep apnea    The current medical regimen is effective;  continue present plan and medications.           Follow up plan: Return in about 3 months (around 02/01/2016) for a1c.

## 2015-11-01 NOTE — Assessment & Plan Note (Signed)
The current medical regimen is effective;  continue present plan and medications.  

## 2015-11-02 ENCOUNTER — Encounter: Payer: Self-pay | Admitting: Family Medicine

## 2015-11-02 LAB — BASIC METABOLIC PANEL
BUN / CREAT RATIO: 27 — AB (ref 10–24)
BUN: 19 mg/dL (ref 8–27)
CO2: 24 mmol/L (ref 18–29)
CREATININE: 0.7 mg/dL — AB (ref 0.76–1.27)
Calcium: 9.4 mg/dL (ref 8.6–10.2)
Chloride: 100 mmol/L (ref 96–106)
GFR calc Af Amer: 117 mL/min/{1.73_m2} (ref >59)
GFR, EST NON AFRICAN AMERICAN: 101 mL/min/{1.73_m2} (ref >59)
Glucose: 109 mg/dL — ABNORMAL HIGH (ref 65–99)
Potassium: 4.6 mmol/L (ref 3.5–5.2)
SODIUM: 139 mmol/L (ref 134–144)

## 2016-02-07 ENCOUNTER — Encounter: Payer: Self-pay | Admitting: Family Medicine

## 2016-02-07 ENCOUNTER — Ambulatory Visit (INDEPENDENT_AMBULATORY_CARE_PROVIDER_SITE_OTHER): Payer: 59 | Admitting: Family Medicine

## 2016-02-07 VITALS — BP 134/68 | Temp 97.7°F | Wt 245.0 lb

## 2016-02-07 DIAGNOSIS — I10 Essential (primary) hypertension: Secondary | ICD-10-CM | POA: Diagnosis not present

## 2016-02-07 DIAGNOSIS — E785 Hyperlipidemia, unspecified: Secondary | ICD-10-CM

## 2016-02-07 DIAGNOSIS — E119 Type 2 diabetes mellitus without complications: Secondary | ICD-10-CM | POA: Diagnosis not present

## 2016-02-07 LAB — BAYER DCA HB A1C WAIVED: HB A1C (BAYER DCA - WAIVED): 6 % (ref <7.0)

## 2016-02-07 NOTE — Assessment & Plan Note (Signed)
The current medical regimen is effective;  continue present plan and medications.  

## 2016-02-07 NOTE — Progress Notes (Signed)
BP 134/68 (BP Location: Left Arm, Patient Position: Sitting, Cuff Size: Normal)   Temp 97.7 F (36.5 C)   Wt 245 lb (111.1 kg)   SpO2 97%   BMI 32.77 kg/m    Subjective:    Patient ID: Alejandro Ryan, male    DOB: 01-01-1951, 65 y.o.   MRN: [id number]  HPI: Alejandro Julianeil Wegner is a 65 y.o. male  Chief Complaint  Patient presents with  . Diabetes   . Hypertension  . Hyperlipidemia  .   Marland Kitchen. Coronary Artery Disease  Recheck doing well no complaints from medications noted low blood sugar spells takes medications faithfully without problems. Blood pressure good control no chest pain chest tightness for coronary artery symptoms. uses CPAP faithfully Patient also getting ready to start weight watchers and spoken to lose significant weight  Relevant past medical, surgical, family and social history reviewed and updated as indicated. Interim medical history since our last visit reviewed. Allergies and medications reviewed and updated.  Review of Systems  Constitutional: Negative.   Respiratory: Negative.   Cardiovascular: Negative.     Per HPI unless specifically indicated above     Objective:    BP 134/68 (BP Location: Left Arm, Patient Position: Sitting, Cuff Size: Normal)   Temp 97.7 F (36.5 C)   Wt 245 lb (111.1 kg)   SpO2 97%   BMI 32.77 kg/m   Wt Readings from Last 3 Encounters:  02/07/16 245 lb (111.1 kg)  11/01/15 245 lb (111.1 kg)  07/27/15 245 lb (111.1 kg)    Physical Exam  Constitutional: He is oriented to person, place, and time. He appears well-developed and well-nourished. No distress.  HENT:  Head: Normocephalic and atraumatic.  Right Ear: Hearing normal.  Left Ear: Hearing normal.  Nose: Nose normal.  Eyes: Conjunctivae and lids are normal. Right eye exhibits no discharge. Left eye exhibits no discharge. No scleral icterus.  Cardiovascular: Normal rate, regular rhythm and normal heart sounds.   Pulmonary/Chest: Effort normal and breath sounds normal. No  respiratory distress.  Musculoskeletal: Normal range of motion.  Neurological: He is alert and oriented to person, place, and time.  Skin: Skin is intact. No rash noted.  Psychiatric: He has a normal mood and affect. His speech is normal and behavior is normal. Judgment and thought content normal. Cognition and memory are normal.    Results for orders placed or performed in visit on 11/01/15  Basic metabolic panel  Result Value Ref Range   Glucose 109 (H) 65 - 99 mg/dL   BUN 19 8 - 27 mg/dL   Creatinine, Ser 4.090.70 (L) 0.76 - 1.27 mg/dL   GFR calc non Af Amer 101 >59 mL/min/1.73   GFR calc Af Amer 117 >59 mL/min/1.73   BUN/Creatinine Ratio 27 (H) 10 - 24   Sodium 139 134 - 144 mmol/L   Potassium 4.6 3.5 - 5.2 mmol/L   Chloride 100 96 - 106 mmol/L   CO2 24 18 - 29 mmol/L   Calcium 9.4 8.6 - 10.2 mg/dL  LP+ALT+AST Piccolo, Waived  Result Value Ref Range   ALT (SGPT) Piccolo, Waived 30 10 - 47 U/L   AST (SGOT) Piccolo, Waived 34 11 - 38 U/L   Cholesterol Piccolo, Waived 148 <200 mg/dL   HDL Chol Piccolo, Waived 52 (L) >59 mg/dL   Triglycerides Piccolo,Waived 151 (H) <150 mg/dL   Chol/HDL Ratio Piccolo,Waive 2.8 mg/dL   LDL Chol Calc Piccolo Waived 66 <100 mg/dL   VLDL Chol Calc Piccolo,Waive 30 (  H) <30 mg/dL  Bayer DCA Hb Z6X Waived  Result Value Ref Range   Bayer DCA Hb A1c Waived 6.4 <7.0 %      Assessment & Plan:   Problem List Items Addressed This Visit      Cardiovascular and Mediastinum   Hypertension    The current medical regimen is effective;  continue present plan and medications.         Endocrine   Diabetes mellitus without complication (HCC) - Primary    Discussed diabetes and adjusting metformin with weight loss      Relevant Orders   Bayer DCA Hb A1c Waived     Other   Hyperlipidemia    The current medical regimen is effective;  continue present plan and medications.        Other Visit Diagnoses   None.      Follow up plan: Return in about  3 months (around 05/08/2016) for Physical Exam, Hemoglobin A1c.

## 2016-02-07 NOTE — Assessment & Plan Note (Signed)
Discussed diabetes and adjusting metformin with weight loss

## 2016-04-18 ENCOUNTER — Encounter: Payer: 59 | Admitting: Family Medicine

## 2016-04-23 ENCOUNTER — Ambulatory Visit (INDEPENDENT_AMBULATORY_CARE_PROVIDER_SITE_OTHER): Payer: 59 | Admitting: Family Medicine

## 2016-04-23 ENCOUNTER — Encounter: Payer: Self-pay | Admitting: Family Medicine

## 2016-04-23 VITALS — BP 149/78 | HR 74 | Temp 98.5°F | Ht 72.5 in | Wt 248.0 lb

## 2016-04-23 DIAGNOSIS — Z125 Encounter for screening for malignant neoplasm of prostate: Secondary | ICD-10-CM | POA: Diagnosis not present

## 2016-04-23 DIAGNOSIS — H9191 Unspecified hearing loss, right ear: Secondary | ICD-10-CM | POA: Diagnosis not present

## 2016-04-23 DIAGNOSIS — I10 Essential (primary) hypertension: Secondary | ICD-10-CM | POA: Diagnosis not present

## 2016-04-23 DIAGNOSIS — Z0000 Encounter for general adult medical examination without abnormal findings: Secondary | ICD-10-CM | POA: Diagnosis not present

## 2016-04-23 DIAGNOSIS — R109 Unspecified abdominal pain: Secondary | ICD-10-CM | POA: Insufficient documentation

## 2016-04-23 DIAGNOSIS — G4730 Sleep apnea, unspecified: Secondary | ICD-10-CM | POA: Diagnosis not present

## 2016-04-23 DIAGNOSIS — E782 Mixed hyperlipidemia: Secondary | ICD-10-CM | POA: Diagnosis not present

## 2016-04-23 DIAGNOSIS — E119 Type 2 diabetes mellitus without complications: Secondary | ICD-10-CM | POA: Diagnosis not present

## 2016-04-23 DIAGNOSIS — R1011 Right upper quadrant pain: Secondary | ICD-10-CM

## 2016-04-23 DIAGNOSIS — E7800 Pure hypercholesterolemia, unspecified: Secondary | ICD-10-CM

## 2016-04-23 LAB — URINALYSIS, ROUTINE W REFLEX MICROSCOPIC
Bilirubin, UA: NEGATIVE
GLUCOSE, UA: NEGATIVE
KETONES UA: NEGATIVE
LEUKOCYTES UA: NEGATIVE
NITRITE UA: NEGATIVE
RBC, UA: NEGATIVE
SPEC GRAV UA: 1.02 (ref 1.005–1.030)
Urobilinogen, Ur: 0.2 mg/dL (ref 0.2–1.0)
pH, UA: 7 (ref 5.0–7.5)

## 2016-04-23 LAB — MICROSCOPIC EXAMINATION
RBC MICROSCOPIC, UA: NONE SEEN /HPF (ref 0–?)
WBC, UA: NONE SEEN /hpf (ref 0–?)

## 2016-04-23 LAB — MICROALBUMIN, URINE WAIVED
Creatinine, Urine Waived: 100 mg/dL (ref 10–300)
Microalb, Ur Waived: 80 mg/L — ABNORMAL HIGH (ref 0–19)

## 2016-04-23 LAB — BAYER DCA HB A1C WAIVED: HB A1C (BAYER DCA - WAIVED): 6.3 % (ref <7.0)

## 2016-04-23 MED ORDER — LOVASTATIN 40 MG PO TABS: 40.0000 mg | ORAL_TABLET | Freq: Every day | ORAL | 4 refills | Status: DC

## 2016-04-23 MED ORDER — METFORMIN HCL ER 500 MG PO TB24: 1000.0000 mg | ORAL_TABLET | Freq: Every evening | ORAL | 4 refills | Status: DC

## 2016-04-23 MED ORDER — CLONIDINE HCL 0.1 MG PO TABS: 0.1000 mg | ORAL_TABLET | Freq: Two times a day (BID) | ORAL | 4 refills | Status: DC

## 2016-04-23 MED ORDER — METOPROLOL SUCCINATE ER 50 MG PO TB24: 50.0000 mg | ORAL_TABLET | Freq: Every day | ORAL | 4 refills | Status: DC

## 2016-04-23 MED ORDER — OLMESARTAN-AMLODIPINE-HCTZ 40-10-25 MG PO TABS: 1.0000 | ORAL_TABLET | Freq: Every day | ORAL | 4 refills | Status: DC

## 2016-04-23 NOTE — Assessment & Plan Note (Signed)
Referral to ENT to further evaluate.

## 2016-04-23 NOTE — Assessment & Plan Note (Signed)
Discussed CPAP patient will get back use faithfully.

## 2016-04-23 NOTE — Assessment & Plan Note (Signed)
Blood pressure with poor control just off cruise and hasn't been using CPAP due to complications will get back on CPAP and healthy diet and reevaluate blood pressure.

## 2016-04-23 NOTE — Assessment & Plan Note (Signed)
The current medical regimen is effective;  continue present plan and medications.  

## 2016-04-23 NOTE — Progress Notes (Signed)
BP (!) 149/78   Pulse 74   Temp 98.5 F (36.9 C)   Ht 6' 0.5" (1.842 m)   Wt 248 lb (112.5 kg)   SpO2 96%   BMI 33.17 kg/m    Subjective:    Patient ID: Alejandro Ryan, male    DOB: 09-May-1951, 65 y.o.   MRN: [id number]  HPI: Alejandro Julianeil Fritzsche is a 65 y.o. male  Chief Complaint  Patient presents with  . Annual Exam  Patient all in all doing well concerned has some possible gallstones symptoms. Patient approximately 10 years ago passed a gallstone dramatically in the emergency room. Since then hasn't had any problems until just the last month or so with after a heavy meal will feel some dyspepsia and some pain from his right upper quadrant into his right mid back area. This may last a couple days or so then gradually eases off.  Patient also with diminished hearing in his right ear has been chronic nothing acute. Also has been struggling to use CPAP lately which may be contributing to elevated blood pressure. No low blood sugar spells or issues with diabetes. Patient's other medical conditions doing well except for just back from a cruise. No low blood sugar spells no issues with medications no chest pain chest tightness  Relevant past medical, surgical, family and social history reviewed and updated as indicated. Interim medical history since our last visit reviewed. Allergies and medications reviewed and updated.  Review of Systems  Constitutional: Negative.   HENT: Negative.   Eyes: Negative.   Respiratory: Negative.   Cardiovascular: Negative.   Gastrointestinal: Negative.   Endocrine: Negative.   Genitourinary: Negative.   Musculoskeletal: Negative.   Skin: Negative.   Allergic/Immunologic: Negative.   Neurological: Negative.   Hematological: Negative.   Psychiatric/Behavioral: Negative.     Per HPI unless specifically indicated above     Objective:    BP (!) 149/78   Pulse 74   Temp 98.5 F (36.9 C)   Ht 6' 0.5" (1.842 m)   Wt 248 lb (112.5 kg)   SpO2 96%    BMI 33.17 kg/m   Wt Readings from Last 3 Encounters:  04/23/16 248 lb (112.5 kg)  02/07/16 245 lb (111.1 kg)  11/01/15 245 lb (111.1 kg)    Physical Exam  Constitutional: He is oriented to person, place, and time. He appears well-developed and well-nourished.  HENT:  Head: Normocephalic and atraumatic.  Right Ear: External ear normal.  Left Ear: External ear normal.  Eyes: Conjunctivae and EOM are normal. Pupils are equal, round, and reactive to light.  Neck: Normal range of motion. Neck supple.  Cardiovascular: Normal rate, regular rhythm, normal heart sounds and intact distal pulses.   Pulmonary/Chest: Effort normal and breath sounds normal.  Abdominal: Soft. Bowel sounds are normal. There is no splenomegaly or hepatomegaly.  Genitourinary: Rectum normal, prostate normal and penis normal.  Musculoskeletal: Normal range of motion.  Neurological: He is alert and oriented to person, place, and time. He has normal reflexes.  Skin: No rash noted. No erythema.  Psychiatric: He has a normal mood and affect. His behavior is normal. Judgment and thought content normal.    Results for orders placed or performed in visit on 02/07/16  Bayer DCA Hb A1c Waived  Result Value Ref Range   Bayer DCA Hb A1c Waived 6.0 <7.0 %      Assessment & Plan:   Problem List Items Addressed This Visit      Cardiovascular  and Mediastinum   Hypertension    Blood pressure with poor control just off cruise and hasn't been using CPAP due to complications will get back on CPAP and healthy diet and reevaluate blood pressure.      Relevant Medications   cloNIDine (CATAPRES) 0.1 MG tablet   lovastatin (MEVACOR) 40 MG tablet   metoprolol succinate (TOPROL-XL) 50 MG 24 hr tablet   Olmesartan-Amlodipine-HCTZ (TRIBENZOR) 40-10-25 MG TABS   Other Relevant Orders   Comprehensive metabolic panel   CBC with Differential/Platelet   Urinalysis, Routine w reflex microscopic (not [facility])     Respiratory   Sleep  apnea    Discussed CPAP patient will get back use faithfully.        Endocrine   Diabetes mellitus without complication (HCC) - Primary   Relevant Medications   lovastatin (MEVACOR) 40 MG tablet   metFORMIN (GLUCOPHAGE XR) 500 MG 24 hr tablet   Olmesartan-Amlodipine-HCTZ (TRIBENZOR) 40-10-25 MG TABS   Other Relevant Orders   Microalbumin, Urine Waived   Bayer DCA Hb A1c Waived     Nervous and Auditory   Deafness in right ear    Referral to ENT to further evaluate.      Relevant Orders   Ambulatory referral to ENT     Other   Hyperlipidemia    The current medical regimen is effective;  continue present plan and medications.       Relevant Medications   cloNIDine (CATAPRES) 0.1 MG tablet   lovastatin (MEVACOR) 40 MG tablet   metoprolol succinate (TOPROL-XL) 50 MG 24 hr tablet   Olmesartan-Amlodipine-HCTZ (TRIBENZOR) 40-10-25 MG TABS   Other Relevant Orders   Lipid Profile   Abdominal pain    With abdominal pain radiating to back and history of previous gallstones approximately 10 years ago will get limited ultrasound of abdomen looking for gallstones.      Relevant Orders   US Abdomen Limited RUQ    Other Visit Diagnoses    Routine general medical examination at a health care facility       Relevant Orders   Comprehensive metabolic panel   CBC with Differential/Platelet   Lipid Profile   PSA   TSH   Urinalysis, Routine w reflex microscopic (not [facility])   Microalbumin, Urine Waived   Bayer DCA Hb A1c Waived   Screening PSA (prostate specific antigen)       Relevant Orders   PSA       Follow up plan: Return in about 3 months (around 07/24/2016) for Hemoglobin A1c, BP check.

## 2016-04-23 NOTE — Assessment & Plan Note (Signed)
With abdominal pain radiating to back and history of previous gallstones approximately 10 years ago will get limited ultrasound of abdomen looking for gallstones.

## 2016-04-24 ENCOUNTER — Telehealth: Payer: Self-pay | Admitting: Family Medicine

## 2016-04-24 DIAGNOSIS — I10 Essential (primary) hypertension: Secondary | ICD-10-CM

## 2016-04-24 DIAGNOSIS — E119 Type 2 diabetes mellitus without complications: Secondary | ICD-10-CM

## 2016-04-24 DIAGNOSIS — E7800 Pure hypercholesterolemia, unspecified: Secondary | ICD-10-CM

## 2016-04-24 MED ORDER — OLMESARTAN-AMLODIPINE-HCTZ 40-10-25 MG PO TABS: 1.0000 | ORAL_TABLET | Freq: Every day | ORAL | 4 refills | Status: DC

## 2016-04-24 MED ORDER — CLONIDINE HCL 0.1 MG PO TABS: 0.1000 mg | ORAL_TABLET | Freq: Two times a day (BID) | ORAL | 4 refills | Status: DC

## 2016-04-24 MED ORDER — METFORMIN HCL ER 500 MG PO TB24: 1000.0000 mg | ORAL_TABLET | Freq: Every evening | ORAL | 4 refills | Status: DC

## 2016-04-24 MED ORDER — METOPROLOL SUCCINATE ER 50 MG PO TB24: 50.0000 mg | ORAL_TABLET | Freq: Every day | ORAL | 4 refills | Status: DC

## 2016-04-24 MED ORDER — LOVASTATIN 40 MG PO TABS: 40.0000 mg | ORAL_TABLET | Freq: Every day | ORAL | 4 refills | Status: DC

## 2016-04-24 NOTE — Telephone Encounter (Signed)
Pt called stated [facility] Please resend ASAP. [facility] Thanks.

## 2016-04-24 NOTE — Telephone Encounter (Signed)
Can you please send Clonidinem Lovastation, Metformin, Metoprolol and Olmesartan-Amlodipine-HCTZ to [facility] instead? Thanks!

## 2016-04-25 ENCOUNTER — Encounter: Payer: Self-pay | Admitting: Family Medicine

## 2016-04-25 LAB — CBC WITH DIFFERENTIAL/PLATELET
BASOS ABS: 0 10*3/uL (ref 0.0–0.2)
Basos: 0 %
EOS (ABSOLUTE): 0.1 10*3/uL (ref 0.0–0.4)
EOS: 2 %
Hematocrit: 40.9 % (ref 37.5–51.0)
Hemoglobin: 13.7 g/dL (ref 12.6–17.7)
IMMATURE GRANULOCYTES: 0 %
Immature Grans (Abs): 0 10*3/uL (ref 0.0–0.1)
LYMPHS ABS: 2.4 10*3/uL (ref 0.7–3.1)
Lymphs: 36 %
MCH: 31.4 pg (ref 26.6–33.0)
MCHC: 33.5 g/dL (ref 31.5–35.7)
MCV: 94 fL (ref 79–97)
MONOCYTES: 8 %
MONOS ABS: 0.5 10*3/uL (ref 0.1–0.9)
NEUTROS PCT: 54 %
Neutrophils Absolute: 3.5 10*3/uL (ref 1.4–7.0)
Platelets: 222 10*3/uL (ref 150–379)
RBC: 4.36 x10E6/uL (ref 4.14–5.80)
RDW: 14.8 % (ref 12.3–15.4)
WBC: 6.6 10*3/uL (ref 3.4–10.8)

## 2016-04-25 LAB — COMPREHENSIVE METABOLIC PANEL
ALBUMIN: 5 g/dL — AB (ref 3.6–4.8)
ALK PHOS: 52 IU/L (ref 39–117)
ALT: 36 IU/L (ref 0–44)
AST: 29 IU/L (ref 0–40)
Albumin/Globulin Ratio: 1.6 (ref 1.2–2.2)
BILIRUBIN TOTAL: 0.4 mg/dL (ref 0.0–1.2)
BUN / CREAT RATIO: 22 (ref 10–24)
BUN: 18 mg/dL (ref 8–27)
CHLORIDE: 96 mmol/L (ref 96–106)
CO2: 29 mmol/L (ref 18–29)
CREATININE: 0.82 mg/dL (ref 0.76–1.27)
Calcium: 10.3 mg/dL — ABNORMAL HIGH (ref 8.6–10.2)
GFR calc Af Amer: 109 mL/min/{1.73_m2} (ref >59)
GFR calc non Af Amer: 94 mL/min/{1.73_m2} (ref >59)
GLUCOSE: 112 mg/dL — AB (ref 65–99)
Globulin, Total: 3.1 g/dL (ref 1.5–4.5)
Potassium: 4.4 mmol/L (ref 3.5–5.2)
SODIUM: 140 mmol/L (ref 134–144)
Total Protein: 8.1 g/dL (ref 6.0–8.5)

## 2016-04-25 LAB — LIPID PANEL
CHOLESTEROL TOTAL: 211 mg/dL — AB (ref 100–199)
Chol/HDL Ratio: 3.9 ratio units (ref 0.0–5.0)
HDL: 54 mg/dL (ref >39)
LDL Calculated: 82 mg/dL (ref 0–99)
TRIGLYCERIDES: 373 mg/dL — AB (ref 0–149)
VLDL Cholesterol Cal: 75 mg/dL — ABNORMAL HIGH (ref 5–40)

## 2016-04-25 LAB — PSA: Prostate Specific Ag, Serum: 2.1 ng/mL (ref 0.0–4.0)

## 2016-04-25 LAB — TSH: TSH: 1.44 u[IU]/mL (ref 0.450–4.500)

## 2016-05-01 ENCOUNTER — Telehealth: Payer: Self-pay | Admitting: Family Medicine

## 2016-05-01 ENCOUNTER — Ambulatory Visit
Admission: RE | Admit: 2016-05-01 | Discharge: 2016-05-01 | Disposition: A | Discharge status: Home / Self Care | Payer: 59 | Source: Ambulatory Visit | Attending: Family Medicine | Admitting: Family Medicine

## 2016-05-01 DIAGNOSIS — R1011 Right upper quadrant pain: Secondary | ICD-10-CM | POA: Diagnosis present

## 2016-05-01 DIAGNOSIS — K760 Fatty (change of) liver, not elsewhere classified: Secondary | ICD-10-CM | POA: Diagnosis not present

## 2016-05-01 DIAGNOSIS — E119 Type 2 diabetes mellitus without complications: Secondary | ICD-10-CM

## 2016-05-01 MED ORDER — METFORMIN HCL ER 500 MG PO TB24: 1000.0000 mg | ORAL_TABLET | Freq: Two times a day (BID) | ORAL | 4 refills | Status: DC

## 2016-05-01 NOTE — Telephone Encounter (Signed)
Order number [id number] Pharmacy would like clarification on quantity and instructions for metFORMIN (GLUCOPHAGE XR) 500 MG 24 hr tablet.

## 2016-05-01 NOTE — Telephone Encounter (Signed)
Routing to provider.   [facility] claims the directions to not match the 90 day supply.

## 2016-07-02 ENCOUNTER — Telehealth: Payer: Self-pay | Admitting: Family Medicine

## 2016-07-02 NOTE — Telephone Encounter (Signed)
Reviewed [facility], Diabetes was d/x on 05/18/03, HTN 09/24/12. Relayed information to patient.

## 2016-07-24 ENCOUNTER — Ambulatory Visit: Payer: 59 | Admitting: Family Medicine

## 2016-07-30 ENCOUNTER — Ambulatory Visit (INDEPENDENT_AMBULATORY_CARE_PROVIDER_SITE_OTHER): Payer: 59 | Admitting: Family Medicine

## 2016-07-30 ENCOUNTER — Encounter: Payer: Self-pay | Admitting: Family Medicine

## 2016-07-30 VITALS — BP 151/82 | HR 67 | Ht 73.0 in | Wt 250.0 lb

## 2016-07-30 DIAGNOSIS — E782 Mixed hyperlipidemia: Secondary | ICD-10-CM | POA: Diagnosis not present

## 2016-07-30 DIAGNOSIS — E119 Type 2 diabetes mellitus without complications: Secondary | ICD-10-CM

## 2016-07-30 DIAGNOSIS — I10 Essential (primary) hypertension: Secondary | ICD-10-CM

## 2016-07-30 MED ORDER — CLONIDINE HCL 0.2 MG PO TABS: 0.2000 mg | ORAL_TABLET | Freq: Two times a day (BID) | ORAL | 1 refills | Status: DC

## 2016-07-30 NOTE — Assessment & Plan Note (Signed)
Poor control. Discussed diet exercise nutrition weight loss-diet. Will increase clonidine to 0.2 mg twice a day. If not improving blood pressure will refer to nephrology for resistant hypertension evaluation.

## 2016-07-30 NOTE — Progress Notes (Signed)
BP (!) 151/82   Pulse 67   Ht 6\' 1"  (1.854 m)   Wt 250 lb (113.4 kg)   SpO2 98%   BMI 32.98 kg/m    Subjective:    Patient ID: Alejandro Ryan, male    DOB: November 16, 1950, 66 y.o.   MRN: [id number]  HPI: Alejandro Ryan is a 66 y.o. male  Chief Complaint  Patient presents with  . Follow-up  . Diabetes  Patient doing well with diabetes is close and on a house and has been off his diet had lost weight but now is gained weight back. All in all diabetes doing well taking metformin without problems noted low blood sugar spells. Blood pressure though is been elevated taking medications faithfully without problems very limited alcohol. No chest pain chest tightness  Relevant past medical, surgical, family and social history reviewed and updated as indicated. Interim medical history since our last visit reviewed. Allergies and medications reviewed and updated.  Review of Systems  Constitutional: Negative.   HENT: Negative.   Eyes: Negative.   Respiratory: Negative.   Cardiovascular: Negative.   Gastrointestinal: Negative.   Endocrine: Negative.   Genitourinary: Negative.   Musculoskeletal: Negative.   Skin: Negative.   Allergic/Immunologic: Negative.   Neurological: Negative.   Hematological: Negative.   Psychiatric/Behavioral: Negative.     Per HPI unless specifically indicated above     Objective:    BP (!) 151/82   Pulse 67   Ht 6\' 1"  (1.854 m)   Wt 250 lb (113.4 kg)   SpO2 98%   BMI 32.98 kg/m   Wt Readings from Last 3 Encounters:  07/30/16 250 lb (113.4 kg)  04/23/16 248 lb (112.5 kg)  02/07/16 245 lb (111.1 kg)    Physical Exam  Constitutional: He is oriented to person, place, and time. He appears well-developed and well-nourished.  HENT:  Head: Normocephalic and atraumatic.  Eyes: Conjunctivae and EOM are normal.  Neck: Normal range of motion.  Cardiovascular: Normal rate, regular rhythm and normal heart sounds.   Pulmonary/Chest: Effort normal and breath  sounds normal.  Musculoskeletal: Normal range of motion.  Neurological: He is alert and oriented to person, place, and time.  Skin: No erythema.  Psychiatric: He has a normal mood and affect. His behavior is normal. Judgment and thought content normal.    Results for orders placed or performed in visit on 04/23/16  Microscopic Examination  Result Value Ref Range   WBC, UA None seen 0 - 5 /hpf   RBC, UA None seen 0 - 2 /hpf   Epithelial Cells (non renal) 0-10 0 - 10 /hpf  Comprehensive metabolic panel  Result Value Ref Range   Glucose 112 (H) 65 - 99 mg/dL   BUN 18 8 - 27 mg/dL   Creatinine, Ser 7.82 0.76 - 1.27 mg/dL   GFR calc non Af Amer 94 >59 mL/min/1.73   GFR calc Af Amer 109 >59 mL/min/1.73   BUN/Creatinine Ratio 22 10 - 24   Sodium 140 134 - 144 mmol/L   Potassium 4.4 3.5 - 5.2 mmol/L   Chloride 96 96 - 106 mmol/L   CO2 29 18 - 29 mmol/L   Calcium 10.3 (H) 8.6 - 10.2 mg/dL   Total Protein 8.1 6.0 - 8.5 g/dL   Albumin 5.0 (H) 3.6 - 4.8 g/dL   Globulin, Total 3.1 1.5 - 4.5 g/dL   Albumin/Globulin Ratio 1.6 1.2 - 2.2   Bilirubin Total 0.4 0.0 - 1.2 mg/dL   Alkaline  Phosphatase 52 39 - 117 IU/L   AST 29 0 - 40 IU/L   ALT 36 0 - 44 IU/L  CBC with Differential/Platelet  Result Value Ref Range   WBC 6.6 3.4 - 10.8 x10E3/uL   RBC 4.36 4.14 - 5.80 x10E6/uL   Hemoglobin 13.7 12.6 - 17.7 g/dL   Hematocrit 16.1 09.6 - 51.0 %   MCV 94 79 - 97 fL   MCH 31.4 26.6 - 33.0 pg   MCHC 33.5 31.5 - 35.7 g/dL   RDW 04.5 40.9 - 81.1 %   Platelets 222 150 - 379 x10E3/uL   Neutrophils 54 Not Estab. %   Lymphs 36 Not Estab. %   Monocytes 8 Not Estab. %   Eos 2 Not Estab. %   Basos 0 Not Estab. %   Neutrophils Absolute 3.5 1.4 - 7.0 x10E3/uL   Lymphocytes Absolute 2.4 0.7 - 3.1 x10E3/uL   Monocytes Absolute 0.5 0.1 - 0.9 x10E3/uL   EOS (ABSOLUTE) 0.1 0.0 - 0.4 x10E3/uL   Basophils Absolute 0.0 0.0 - 0.2 x10E3/uL   Immature Granulocytes 0 Not Estab. %   Immature Grans (Abs) 0.0 0.0  - 0.1 x10E3/uL  Lipid Profile  Result Value Ref Range   Cholesterol, Total 211 (H) 100 - 199 mg/dL   Triglycerides 914 (H) 0 - 149 mg/dL   HDL 54 >78 mg/dL   VLDL Cholesterol Cal 75 (H) 5 - 40 mg/dL   LDL Calculated 82 0 - 99 mg/dL   Chol/HDL Ratio 3.9 0.0 - 5.0 ratio units  PSA  Result Value Ref Range   Prostate Specific Ag, Serum 2.1 0.0 - 4.0 ng/mL  TSH  Result Value Ref Range   TSH 1.440 0.450 - 4.500 uIU/mL  Urinalysis, Routine w reflex microscopic (not at [facility])  Result Value Ref Range   Specific Gravity, UA 1.020 1.005 - 1.030   pH, UA 7.0 5.0 - 7.5   Color, UA Yellow Yellow   Appearance Ur Clear Clear   Leukocytes, UA Negative Negative   Protein, UA 1+ (A) Negative/Trace   Glucose, UA Negative Negative   Ketones, UA Negative Negative   RBC, UA Negative Negative   Bilirubin, UA Negative Negative   Urobilinogen, Ur 0.2 0.2 - 1.0 mg/dL   Nitrite, UA Negative Negative   Microscopic Examination See below:   Microalbumin, Urine Waived  Result Value Ref Range   Microalb, Ur Waived 80 (H) 0 - 19 mg/L   Creatinine, Urine Waived 100 10 - 300 mg/dL   Microalb/Creat Ratio 30-300 (H) <30 mg/g  Bayer DCA Hb A1c Waived  Result Value Ref Range   Bayer DCA Hb A1c Waived 6.3 <7.0 %      Assessment & Plan:   Problem List Items Addressed This Visit      Cardiovascular and Mediastinum   Hypertension    Poor control. Discussed diet exercise nutrition weight loss-diet. Will increase clonidine to 0.2 mg twice a day. If not improving blood pressure will refer to nephrology for resistant hypertension evaluation.       Relevant Medications   cloNIDine (CATAPRES) 0.2 MG tablet   Other Relevant Orders   Bayer DCA Hb A1c Waived     Endocrine   Diabetes mellitus without complication (HCC) - Primary    The current medical regimen is effective;  continue present plan and medications.       Relevant Orders   Bayer DCA Hb A1c Waived     Other   Hyperlipidemia  Relevant  Medications   cloNIDine (CATAPRES) 0.2 MG tablet   Other Relevant Orders   Bayer DCA Hb A1c Waived       Follow up plan: Return in about 3 months (around 10/27/2016) for Hemoglobin A1c, BMP,  Lipids, ALT, AST.

## 2016-07-30 NOTE — Assessment & Plan Note (Signed)
The current medical regimen is effective;  continue present plan and medications.  

## 2016-07-31 LAB — BAYER DCA HB A1C WAIVED: HB A1C: 6.6 % (ref <7.0)

## 2016-10-31 ENCOUNTER — Encounter: Payer: Self-pay | Admitting: Family Medicine

## 2016-10-31 ENCOUNTER — Ambulatory Visit (INDEPENDENT_AMBULATORY_CARE_PROVIDER_SITE_OTHER): Payer: 59 | Admitting: Family Medicine

## 2016-10-31 VITALS — BP 134/68 | HR 56 | Ht 72.75 in | Wt 251.0 lb

## 2016-10-31 DIAGNOSIS — E782 Mixed hyperlipidemia: Secondary | ICD-10-CM | POA: Diagnosis not present

## 2016-10-31 DIAGNOSIS — I10 Essential (primary) hypertension: Secondary | ICD-10-CM | POA: Diagnosis not present

## 2016-10-31 DIAGNOSIS — E119 Type 2 diabetes mellitus without complications: Secondary | ICD-10-CM

## 2016-10-31 LAB — LP+ALT+AST PICCOLO, WAIVED
ALT (SGPT) Piccolo, Waived: 36 U/L (ref 10–47)
AST (SGOT) PICCOLO, WAIVED: 41 U/L — AB (ref 11–38)
CHOLESTEROL PICCOLO, WAIVED: 153 mg/dL (ref <200)
Chol/HDL Ratio Piccolo,Waive: 2.9 mg/dL
HDL CHOL PICCOLO, WAIVED: 53 mg/dL — AB (ref >59)
LDL Chol Calc Piccolo Waived: 61 mg/dL (ref <100)
Triglycerides Piccolo,Waived: 199 mg/dL — ABNORMAL HIGH (ref <150)
VLDL Chol Calc Piccolo,Waive: 40 mg/dL — ABNORMAL HIGH (ref <30)

## 2016-10-31 LAB — BAYER DCA HB A1C WAIVED: HB A1C: 5.9 % (ref <7.0)

## 2016-10-31 NOTE — Assessment & Plan Note (Signed)
The current medical regimen is effective;  continue present plan and medications.  

## 2016-10-31 NOTE — Progress Notes (Signed)
   BP 134/68   Pulse (!) 56   Ht 6' 0.75" (1.848 m)   Wt 251 lb (113.9 kg)   SpO2 96%   BMI 33.34 kg/m    Subjective:    Patient ID: Alejandro Ryan, male    DOB: 1950/09/22, 66 y.o.   MRN: [id number]  HPI: Alejandro Ryan is a 66 y.o. male  Chief Complaint  Patient presents with  . Follow-up  . Diabetes  Diabetes doing well noted low blood sugar spells issues with medications aching faithfully without problems. Has been trying to cut back on carbohydrates and doing well had lost some weight and tell this last week on vacation in [place]. Blood pressure doing well with medications no side effects or problems. Cholesterol doing well no side effects or problems.   Relevant past medical, surgical, family and social history reviewed and updated as indicated. Interim medical history since our last visit reviewed. Allergies and medications reviewed and updated.  Review of Systems  Constitutional: Negative.   Respiratory: Negative.   Cardiovascular: Negative.     Per HPI unless specifically indicated above     Objective:    BP 134/68   Pulse (!) 56   Ht 6' 0.75" (1.848 m)   Wt 251 lb (113.9 kg)   SpO2 96%   BMI 33.34 kg/m   Wt Readings from Last 3 Encounters:  10/31/16 251 lb (113.9 kg)  07/30/16 250 lb (113.4 kg)  04/23/16 248 lb (112.5 kg)    Physical Exam  Constitutional: He is oriented to person, place, and time. He appears well-developed and well-nourished.  HENT:  Head: Normocephalic and atraumatic.  Eyes: Conjunctivae and EOM are normal.  Neck: Normal range of motion.  Cardiovascular: Normal rate, regular rhythm and normal heart sounds.   Pulmonary/Chest: Effort normal and breath sounds normal.  Musculoskeletal: Normal range of motion.  Neurological: He is alert and oriented to person, place, and time.  Skin: No erythema.  Psychiatric: He has a normal mood and affect. His behavior is normal. Judgment and thought content normal.    Results for orders placed  or performed in visit on 07/30/16  Bayer DCA Hb A1c Waived  Result Value Ref Range   Bayer DCA Hb A1c Waived 6.6 <7.0 %      Assessment & Plan:   Problem List Items Addressed This Visit      Cardiovascular and Mediastinum   Hypertension - Primary    The current medical regimen is effective;  continue present plan and medications.       Relevant Orders   Basic metabolic panel   Bayer DCA Hb W0JA1c Waived   LP+ALT+AST [place]     Endocrine   Diabetes mellitus without complication (HCC)    The current medical regimen is effective;  continue present plan and medications.       Relevant Orders   Basic metabolic panel   Bayer DCA Hb W1XA1c Waived   LP+ALT+AST [place], Waived     Other   Hyperlipidemia    The current medical regimen is effective;  continue present plan and medications.       Relevant Orders   Basic metabolic panel   Bayer DCA Hb B1YA1c Waived   LP+ALT+AST [place], Waived       Follow up plan: Return in about 3 months (around 01/31/2017) for Hemoglobin A1c.

## 2016-11-01 ENCOUNTER — Encounter: Payer: Self-pay | Admitting: Family Medicine

## 2016-11-01 LAB — BASIC METABOLIC PANEL
BUN/Creatinine Ratio: 18 (ref 10–24)
BUN: 17 mg/dL (ref 8–27)
CALCIUM: 10 mg/dL (ref 8.6–10.2)
CHLORIDE: 99 mmol/L (ref 96–106)
CO2: 26 mmol/L (ref 18–29)
Creatinine, Ser: 0.92 mg/dL (ref 0.76–1.27)
GFR calc Af Amer: 102 mL/min/{1.73_m2} (ref >59)
GFR calc non Af Amer: 88 mL/min/{1.73_m2} (ref >59)
GLUCOSE: 120 mg/dL — AB (ref 65–99)
Potassium: 4.5 mmol/L (ref 3.5–5.2)
Sodium: 139 mmol/L (ref 134–144)

## 2017-01-11 ENCOUNTER — Other Ambulatory Visit: Payer: Self-pay | Admitting: Family Medicine

## 2017-01-11 DIAGNOSIS — I10 Essential (primary) hypertension: Secondary | ICD-10-CM

## 2017-02-11 ENCOUNTER — Other Ambulatory Visit: Payer: Self-pay | Admitting: Family Medicine

## 2017-02-25 ENCOUNTER — Encounter: Payer: Self-pay | Admitting: Family Medicine

## 2017-02-25 ENCOUNTER — Ambulatory Visit (INDEPENDENT_AMBULATORY_CARE_PROVIDER_SITE_OTHER): Payer: 59 | Admitting: Family Medicine

## 2017-02-25 VITALS — BP 136/73 | HR 70 | Wt 260.0 lb

## 2017-02-25 DIAGNOSIS — E782 Mixed hyperlipidemia: Secondary | ICD-10-CM | POA: Diagnosis not present

## 2017-02-25 DIAGNOSIS — E119 Type 2 diabetes mellitus without complications: Secondary | ICD-10-CM

## 2017-02-25 DIAGNOSIS — I10 Essential (primary) hypertension: Secondary | ICD-10-CM | POA: Diagnosis not present

## 2017-02-25 DIAGNOSIS — G4730 Sleep apnea, unspecified: Secondary | ICD-10-CM

## 2017-02-25 DIAGNOSIS — I2510 Atherosclerotic heart disease of native coronary artery without angina pectoris: Secondary | ICD-10-CM | POA: Diagnosis not present

## 2017-02-25 LAB — BAYER DCA HB A1C WAIVED: HB A1C (BAYER DCA - WAIVED): 6 % (ref <7.0)

## 2017-02-25 NOTE — Progress Notes (Addendum)
BP 136/73   Pulse 70   Wt 260 lb (117.9 kg)   SpO2 97%   BMI 34.54 kg/m    Subjective:    Patient ID: Alejandro Ryan, male    DOB: 1951/03/26, 66 y.o.   MRN: [id number]  HPI: Alejandro Ryan is a 66 y.o. male  Chief Complaint  Patient presents with  . Follow-up  . Diabetes  Patient doing well no low blood sugar spells no issues with medication taking medication faithfully. Patient had some spell of elevated blood pressure but it's back on diet exercise and doing better. Patient did have a rough summer with 10 pound weight gain.   Relevant past medical, surgical, family and social history reviewed and updated as indicated. Interim medical history since our last visit reviewed. Allergies and medications reviewed and updated.  Review of Systems  Constitutional: Negative.   Respiratory: Negative.   Cardiovascular: Negative.     Per HPI unless specifically indicated above     Objective:    BP 136/73   Pulse 70   Wt 260 lb (117.9 kg)   SpO2 97%   BMI 34.54 kg/m   Wt Readings from Last 3 Encounters:  02/25/17 260 lb (117.9 kg)  10/31/16 251 lb (113.9 kg)  07/30/16 250 lb (113.4 kg)    Physical Exam  Constitutional: He is oriented to person, place, and time. He appears well-developed and well-nourished.  HENT:  Head: Normocephalic and atraumatic.  Eyes: Conjunctivae and EOM are normal.  Neck: Normal range of motion.  Cardiovascular: Normal rate, regular rhythm and normal heart sounds.   Pulmonary/Chest: Effort normal and breath sounds normal.  Musculoskeletal: Normal range of motion.  Neurological: He is alert and oriented to person, place, and time.  Skin: No erythema.  Psychiatric: He has a normal mood and affect. His behavior is normal. Judgment and thought content normal.    Results for orders placed or performed in visit on 10/31/16  Basic metabolic panel  Result Value Ref Range   Glucose 120 (H) 65 - 99 mg/dL   BUN 17 8 - 27 mg/dL   Creatinine, Ser 5.78  0.76 - 1.27 mg/dL   GFR calc non Af Amer 88 >59 mL/min/1.73   GFR calc Af Amer 102 >59 mL/min/1.73   BUN/Creatinine Ratio 18 10 - 24   Sodium 139 134 - 144 mmol/L   Potassium 4.5 3.5 - 5.2 mmol/L   Chloride 99 96 - 106 mmol/L   CO2 26 18 - 29 mmol/L   Calcium 10.0 8.6 - 10.2 mg/dL  Bayer DCA Hb I6N Waived  Result Value Ref Range   Bayer DCA Hb A1c Waived 5.9 <7.0 %  LP+ALT+AST Piccolo, Waived  Result Value Ref Range   ALT (SGPT) Piccolo, Waived 36 10 - 47 U/L   AST (SGOT) Piccolo, Waived 41 (H) 11 - 38 U/L   Cholesterol Piccolo, Waived 153 <200 mg/dL   HDL Chol Piccolo, Waived 53 (L) >59 mg/dL   Triglycerides Piccolo,Waived 199 (H) <150 mg/dL   Chol/HDL Ratio Piccolo,Waive 2.9 mg/dL   LDL Chol Calc Piccolo Waived 61 <100 mg/dL   VLDL Chol Calc Piccolo,Waive 40 (H) <30 mg/dL      Assessment & Plan:   Problem List Items Addressed This Visit      Cardiovascular and Mediastinum   CAD (coronary artery disease)    The current medical regimen is effective;  continue present plan and medications.       Hypertension - Primary  The current medical regimen is effective;  continue present plan and medications.       Relevant Orders   Bayer DCA Hb A1c Waived     Respiratory   Sleep apnea    Is controlled with weight loss but with weight gain again patient started having issues encourage use of CPAP machine        Endocrine   Diabetes mellitus without complication (HCC)    The current medical regimen is effective;  continue present plan and medications.       Relevant Orders   Bayer DCA Hb A1c Waived     Other   Hyperlipidemia   Relevant Orders   Bayer DCA Hb A1c Waived       Follow up plan: Return in about 3 months (around 05/27/2017) for Physical Exam, Hemoglobin A1c.

## 2017-02-25 NOTE — Assessment & Plan Note (Signed)
Is controlled with weight loss but with weight gain again patient started having issues encourage use of CPAP machine

## 2017-02-25 NOTE — Assessment & Plan Note (Signed)
The current medical regimen is effective;  continue present plan and medications.  

## 2017-05-06 ENCOUNTER — Encounter: Payer: Self-pay | Admitting: Family Medicine

## 2017-05-06 ENCOUNTER — Ambulatory Visit (INDEPENDENT_AMBULATORY_CARE_PROVIDER_SITE_OTHER): Payer: 59 | Admitting: Family Medicine

## 2017-05-06 VITALS — BP 136/70 | HR 61 | Ht 72.84 in | Wt 258.0 lb

## 2017-05-06 DIAGNOSIS — G4730 Sleep apnea, unspecified: Secondary | ICD-10-CM

## 2017-05-06 DIAGNOSIS — I10 Essential (primary) hypertension: Secondary | ICD-10-CM | POA: Diagnosis not present

## 2017-05-06 DIAGNOSIS — Z125 Encounter for screening for malignant neoplasm of prostate: Secondary | ICD-10-CM

## 2017-05-06 DIAGNOSIS — E782 Mixed hyperlipidemia: Secondary | ICD-10-CM

## 2017-05-06 DIAGNOSIS — E7800 Pure hypercholesterolemia, unspecified: Secondary | ICD-10-CM

## 2017-05-06 DIAGNOSIS — E119 Type 2 diabetes mellitus without complications: Secondary | ICD-10-CM | POA: Diagnosis not present

## 2017-05-06 DIAGNOSIS — Z0000 Encounter for general adult medical examination without abnormal findings: Secondary | ICD-10-CM | POA: Diagnosis not present

## 2017-05-06 DIAGNOSIS — I2510 Atherosclerotic heart disease of native coronary artery without angina pectoris: Secondary | ICD-10-CM | POA: Diagnosis not present

## 2017-05-06 DIAGNOSIS — Z1329 Encounter for screening for other suspected endocrine disorder: Secondary | ICD-10-CM

## 2017-05-06 LAB — URINALYSIS, ROUTINE W REFLEX MICROSCOPIC
Bilirubin, UA: NEGATIVE
Glucose, UA: NEGATIVE
KETONES UA: NEGATIVE
Leukocytes, UA: NEGATIVE
NITRITE UA: NEGATIVE
Protein, UA: NEGATIVE
RBC, UA: NEGATIVE
Specific Gravity, UA: 1.02 (ref 1.005–1.030)
UUROB: 0.2 mg/dL (ref 0.2–1.0)
pH, UA: 6.5 (ref 5.0–7.5)

## 2017-05-06 LAB — BAYER DCA HB A1C WAIVED: HB A1C (BAYER DCA - WAIVED): 6 % (ref <7.0)

## 2017-05-06 MED ORDER — METFORMIN HCL ER 500 MG PO TB24: 1000.0000 mg | ORAL_TABLET | Freq: Every day | ORAL | 4 refills | Status: AC

## 2017-05-06 MED ORDER — OLMESARTAN-AMLODIPINE-HCTZ 40-10-25 MG PO TABS: 1.0000 | ORAL_TABLET | Freq: Every day | ORAL | 4 refills | Status: AC

## 2017-05-06 MED ORDER — CLONIDINE HCL 0.2 MG PO TABS: 0.2000 mg | ORAL_TABLET | Freq: Two times a day (BID) | ORAL | 4 refills | Status: AC

## 2017-05-06 MED ORDER — LOVASTATIN 40 MG PO TABS
40.0000 mg | ORAL_TABLET | Freq: Every day | ORAL | 4 refills | Status: AC
Start: 2017-05-06 — End: ?

## 2017-05-06 MED ORDER — METOPROLOL SUCCINATE ER 50 MG PO TB24
50.0000 mg | ORAL_TABLET | Freq: Every day | ORAL | 4 refills | Status: AC
Start: 2017-05-06 — End: ?

## 2017-05-06 NOTE — Assessment & Plan Note (Signed)
The current medical regimen is effective;  continue present plan and medications.  

## 2017-05-06 NOTE — Assessment & Plan Note (Signed)
Using CPAP every night . 

## 2017-05-06 NOTE — Progress Notes (Signed)
BP 136/70 (BP Location: Left Arm)   Pulse 61   Ht 6' 0.84" (1.85 m)   Wt 258 lb (117 kg)   SpO2 96%   BMI 34.19 kg/m    Subjective:    Patient ID: Alejandro Ryan, male    DOB: [birth year], 66 y.o.   MRN: [id number]  HPI: Alejandro Ryan is a 66 y.o. male  Annual exam  Pt follow-up blood pressure doing well no taking medicatins without problems. No chest pahest tightness symptoms. Sleep apnea doing well with his CPAP. Diabetes good control no low blood sugar spells or issues. Cholesterol also doing.  Relevant past medical, surgical, family and social history reviewed and updated as indicated. Interim medical history since our last visit reviewed. Allergies and medications reviewed and updated.  Review of Systems  Constitutional: Negative.   HENT: Negative.   Eyes: Negative.   Respiratory: Negative.   Cardiovascular: Negative.   Gastrointestinal: Negative.   Endocrine: Negative.   Genitourinary: Negative.   Musculoskeletal: Negative.   Skin: Negative.   Allergic/Immunologic: Negative.   Neurological: Negative.   Hematological: Negative.   Psychiatric/Behavioral: Negative.     Per HPI unless specifically indicated above     Objective:    BP 136/70 (BP Location: Left Arm)   Pulse 61   Ht 6' 0.84" (1.85 m)   Wt 258 lb (117 kg)   SpO2 96%   BMI 34.19 kg/m   Wt Readings from Last 3 Encounters:  05/06/17 258 lb (117 kg)  02/25/17 260 lb (117.9 kg)  10/31/16 251 lb (113.9 kg)    Physical Exam  Constitutional: He is oriented to person, place, and time. He appears well-developed and well-nourished.  HENT:  Head: Normocephalic and atraumatic.  Right Ear: External ear normal.  Left Ear: External ear normal.  Eyes: Conjunctivae and EOM are normal. Pupils are equal, round, and reactive to light.  Neck: Normal range of motion. Neck supple.  Cardiovascular: Normal rate, regular rhythm, normal heart sounds and intact distal pulses.  Pulmonary/Chest: Effort normal and  breath sounds normal.  Abdominal: Soft. Bowel sounds are normal. There is no splenomegaly or hepatomegaly.  Genitourinary: Rectum normal, prostate normal and penis normal.  Musculoskeletal: Normal range of motion.  Neurological: He is alert and oriented to person, place, and time. He has normal reflexes.  Skin: No rash noted. No erythema.  Psychiatric: He has a normal mood and affect. His behavior is normal. Judgment and thought content normal.    Results for orders placed or performed in visit on 02/25/17  Bayer DCA Hb A1c Waived  Result Value Ref Range   Bayer DCA Hb A1c Waived 6.0 <7.0 %      Assessment & Plan:   Problem List Items Addressed This Visit      Cardiovascular and Mediastinum   CAD (coronary artery disease)    The current medical regimen is effective;  continue present plan and medications.       Relevant Medications   cloNIDine (CATAPRES) 0.2 MG tablet   lovastatin (MEVACOR) 40 MG tablet   metoprolol succinate (TOPROL-XL) 50 MG 24 hr tablet   Olmesartan-Amlodipine-HCTZ (TRIBENZOR) 40-10-25 MG TABS   Hypertension - Primary    The current medical regimen is effective;  continue present plan and medications.       Relevant Medications   cloNIDine (CATAPRES) 0.2 MG tablet   lovastatin (MEVACOR) 40 MG tablet   metoprolol succinate (TOPROL-XL) 50 MG 24 hr tablet   Olmesartan-Amlodipine-HCTZ (TRIBENZOR) 40-10-25 MG TABS  Other Relevant Orders   CBC with Differential/Platelet   Comprehensive metabolic panel   Urinalysis, Routine w reflex microscopic     Respiratory   Sleep apnea    Using CPAP every night        Endocrine   Diabetes mellitus without complication (HCC)    The current medical regimen is effective;  continue present plan and medications.       Relevant Medications   lovastatin (MEVACOR) 40 MG tablet   metFORMIN (GLUCOPHAGE XR) 500 MG 24 hr tablet   Olmesartan-Amlodipine-HCTZ (TRIBENZOR) 40-10-25 MG TABS   Other Relevant Orders   CBC  with Differential/Platelet   Comprehensive metabolic panel   Urinalysis, Routine w reflex microscopic   Bayer DCA Hb A1c Waived     Other   Hyperlipidemia    The current medical regimen is effective;  continue present plan and medications.       Relevant Medications   cloNIDine (CATAPRES) 0.2 MG tablet   lovastatin (MEVACOR) 40 MG tablet   metoprolol succinate (TOPROL-XL) 50 MG 24 hr tablet   Olmesartan-Amlodipine-HCTZ (TRIBENZOR) 40-10-25 MG TABS   Other Relevant Orders   CBC with Differential/Platelet   Comprehensive metabolic panel   Lipid panel    Other Visit Diagnoses    Prostate cancer screening       Relevant Orders   PSA   Thyroid disorder screen       Relevant Orders   TSH   PE (physical exam), annual           Follow up plan: Return in about 6 months (around 11/04/2017) for BMP,  Lipids, ALT, AST, Hemoglobin A1c.

## 2017-05-07 ENCOUNTER — Encounter: Payer: Self-pay | Admitting: Family Medicine

## 2017-05-07 LAB — COMPREHENSIVE METABOLIC PANEL
A/G RATIO: 1.7 (ref 1.2–2.2)
ALBUMIN: 4.8 g/dL (ref 3.6–4.8)
ALT: 32 IU/L (ref 0–44)
AST: 28 IU/L (ref 0–40)
Alkaline Phosphatase: 38 IU/L — ABNORMAL LOW (ref 39–117)
BILIRUBIN TOTAL: 0.5 mg/dL (ref 0.0–1.2)
BUN / CREAT RATIO: 20 (ref 10–24)
BUN: 16 mg/dL (ref 8–27)
CHLORIDE: 99 mmol/L (ref 96–106)
CO2: 24 mmol/L (ref 20–29)
Calcium: 9.7 mg/dL (ref 8.6–10.2)
Creatinine, Ser: 0.79 mg/dL (ref 0.76–1.27)
GFR calc non Af Amer: 95 mL/min/{1.73_m2} (ref >59)
GFR, EST AFRICAN AMERICAN: 110 mL/min/{1.73_m2} (ref >59)
GLUCOSE: 115 mg/dL — AB (ref 65–99)
Globulin, Total: 2.8 g/dL (ref 1.5–4.5)
Potassium: 4.4 mmol/L (ref 3.5–5.2)
Sodium: 139 mmol/L (ref 134–144)
TOTAL PROTEIN: 7.6 g/dL (ref 6.0–8.5)

## 2017-05-07 LAB — CBC WITH DIFFERENTIAL/PLATELET
BASOS: 1 %
Basophils Absolute: 0 10*3/uL (ref 0.0–0.2)
EOS (ABSOLUTE): 0.1 10*3/uL (ref 0.0–0.4)
Eos: 2 %
Hematocrit: 40.4 % (ref 37.5–51.0)
Hemoglobin: 13.4 g/dL (ref 13.0–17.7)
IMMATURE GRANS (ABS): 0 10*3/uL (ref 0.0–0.1)
Immature Granulocytes: 0 %
LYMPHS: 44 %
Lymphocytes Absolute: 2.3 10*3/uL (ref 0.7–3.1)
MCH: 30.7 pg (ref 26.6–33.0)
MCHC: 33.2 g/dL (ref 31.5–35.7)
MCV: 93 fL (ref 79–97)
MONOCYTES: 8 %
Monocytes Absolute: 0.4 10*3/uL (ref 0.1–0.9)
NEUTROS ABS: 2.4 10*3/uL (ref 1.4–7.0)
Neutrophils: 45 %
Platelets: 205 10*3/uL (ref 150–379)
RBC: 4.36 x10E6/uL (ref 4.14–5.80)
RDW: 14.6 % (ref 12.3–15.4)
WBC: 5.3 10*3/uL (ref 3.4–10.8)

## 2017-05-07 LAB — LIPID PANEL
CHOL/HDL RATIO: 3.5 ratio (ref 0.0–5.0)
Cholesterol, Total: 173 mg/dL (ref 100–199)
HDL: 49 mg/dL (ref >39)
LDL Calculated: 81 mg/dL (ref 0–99)
Triglycerides: 215 mg/dL — ABNORMAL HIGH (ref 0–149)
VLDL CHOLESTEROL CAL: 43 mg/dL — AB (ref 5–40)

## 2017-05-07 LAB — TSH: TSH: 2.62 u[IU]/mL (ref 0.450–4.500)

## 2017-05-07 LAB — PSA: PROSTATE SPECIFIC AG, SERUM: 1.9 ng/mL (ref 0.0–4.0)

## 2017-10-07 IMAGING — US US ABDOMEN LIMITED
1 series · 14 of 25 positions shown · non-contrast
Comparison: None available

CLINICAL DATA: Right upper quadrant pain.

EXAM:
US ABDOMEN LIMITED - RIGHT UPPER QUADRANT

[Series 1: us abdomen limited · 0.26mm/px · 14 of 60 slices shown]
[im 1/60]
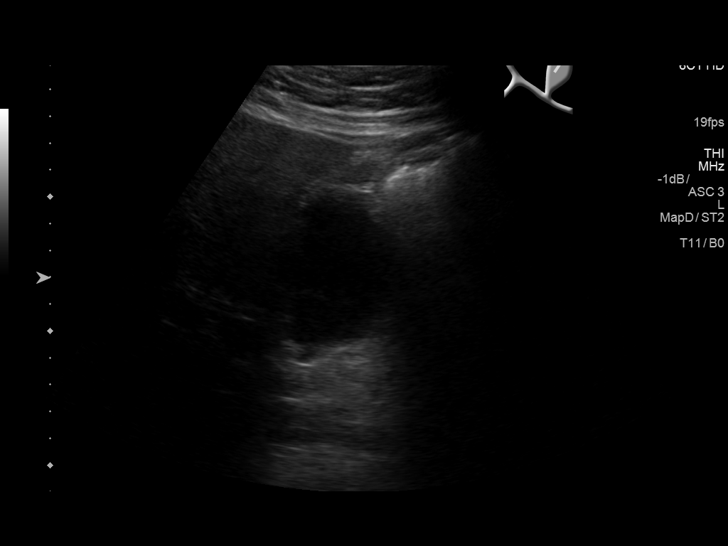
[im 5/60]
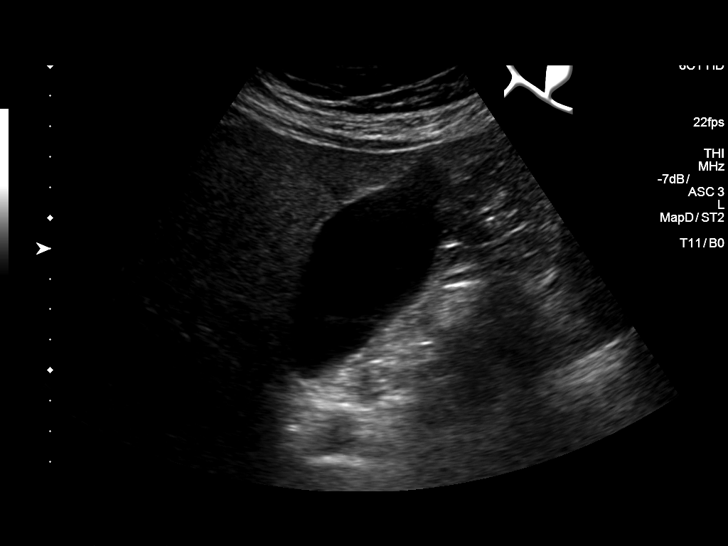
[im 10/60]
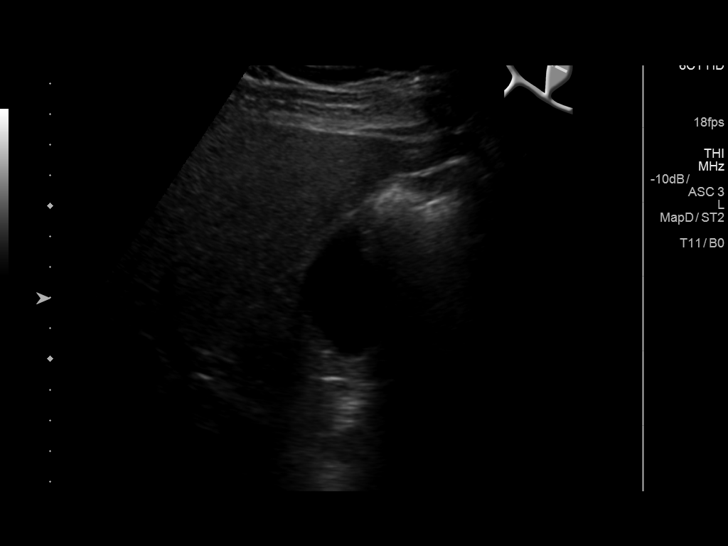
[im 15/60]
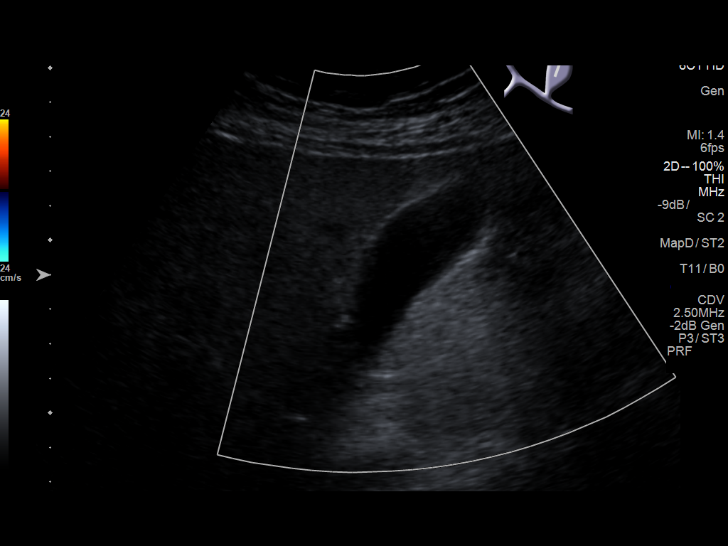
[im 20/60]
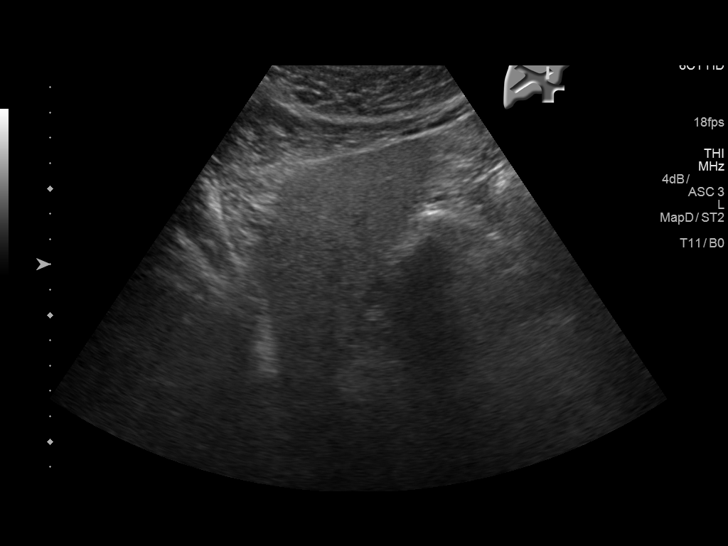
[im 23/60]
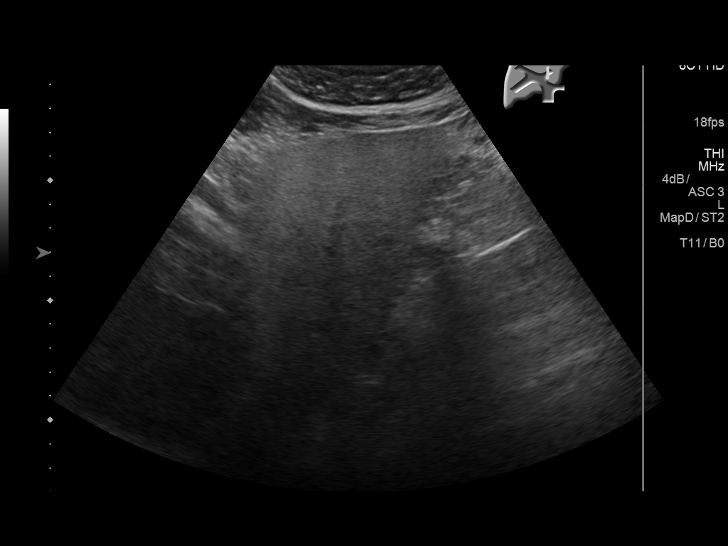
[im 28/60]
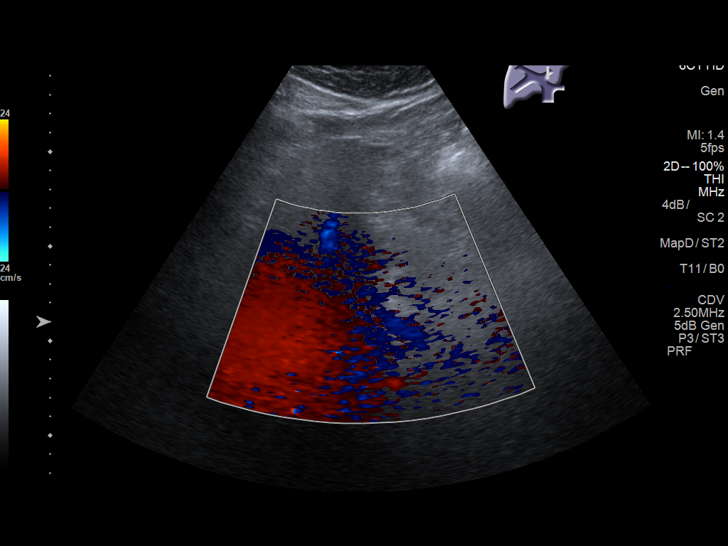
[im 32/60]
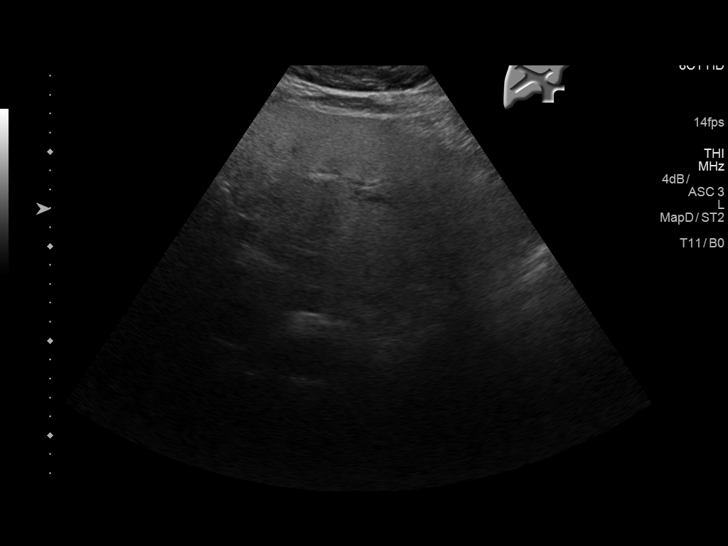
[im 37/60]
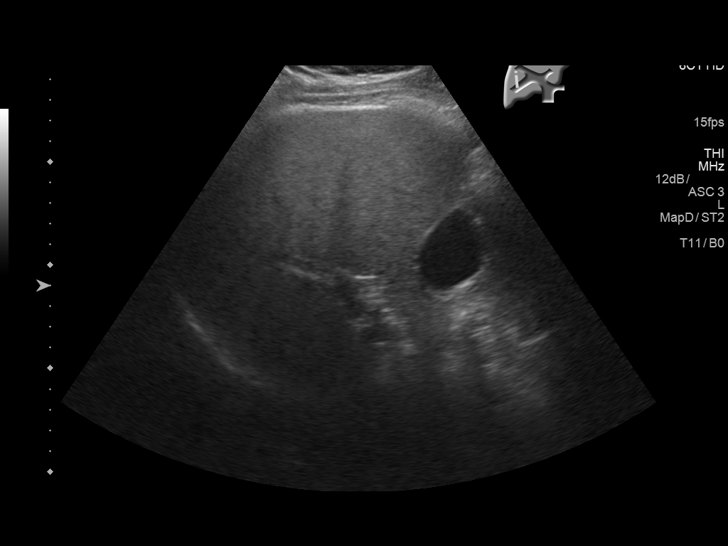
[im 40/60]
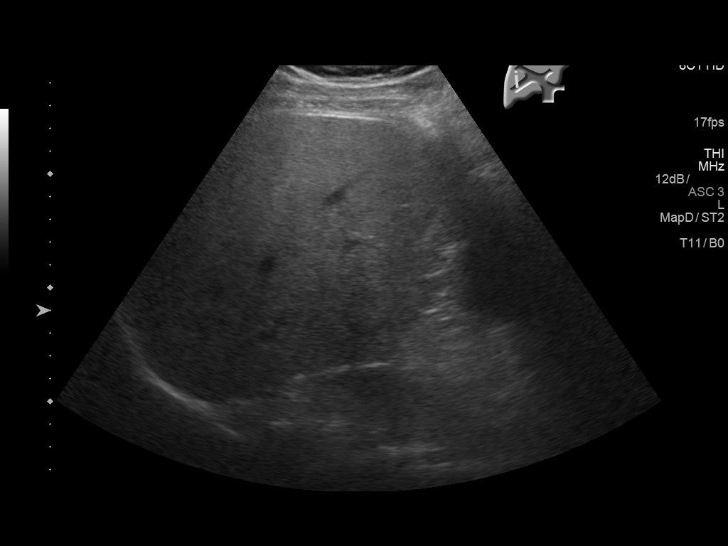
[im 45/60]
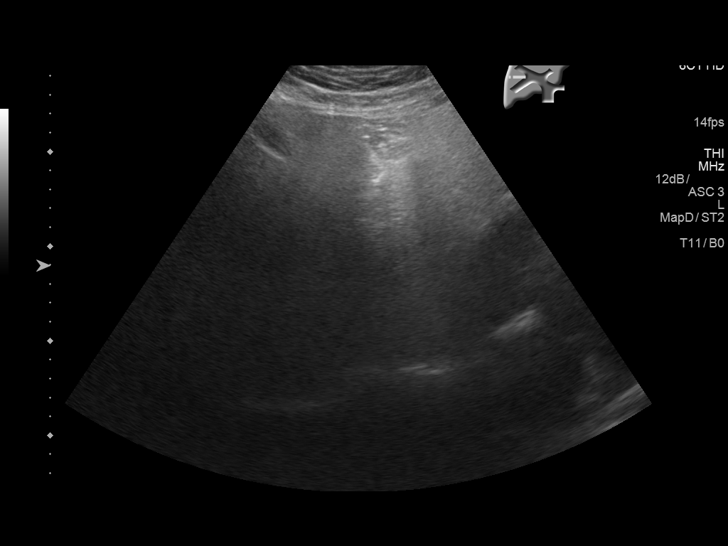
[im 50/60]
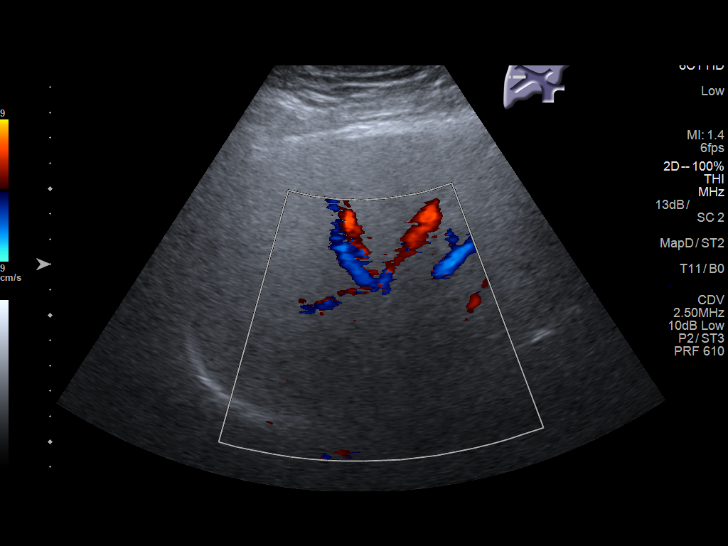
[im 55/60]
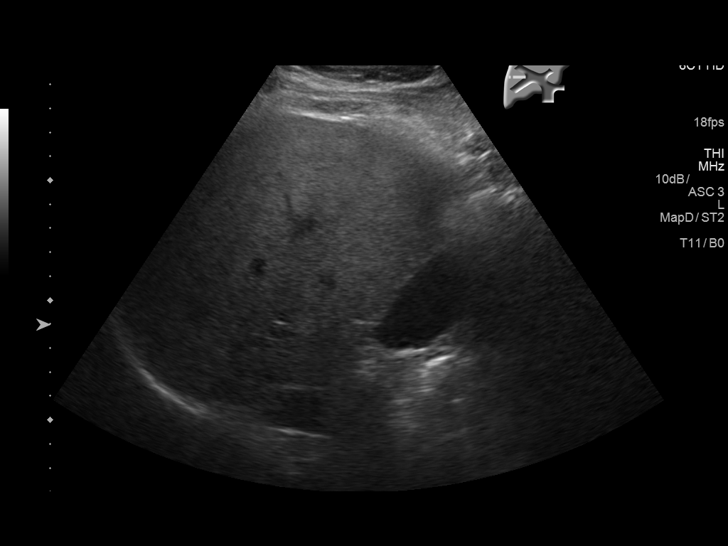
[im 60/60]
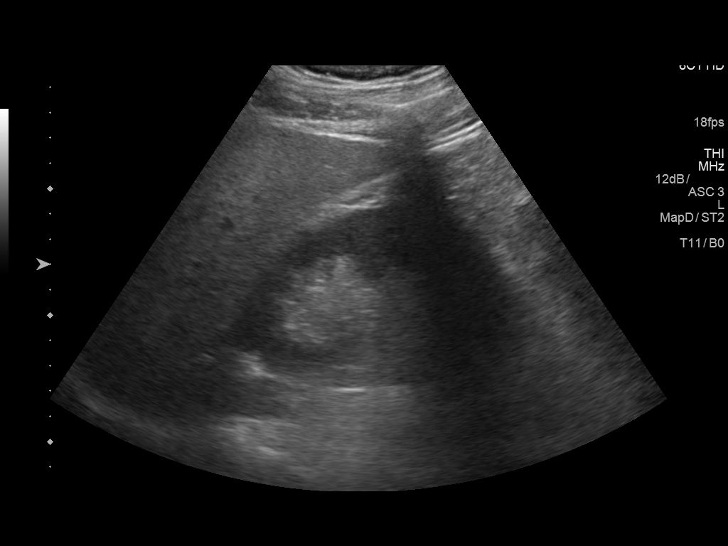

[14 of 25 positions shown; findings below may reference images not displayed]

FINDINGS: Gallbladder:

No gallstones or wall thickening visualized. No sonographic Murphy
sign noted by sonographer.

Common bile duct:

Diameter: 4 mm

Liver:

Echogenic liver with poor acoustic penetration and sparing at the
gallbladder fossa. No evidence of mass. Antegrade flow in the imaged
hepatic and portal venous system.
IMPRESSION: 1. Hepatic steatosis.
2. Negative gallbladder.

## 2017-10-10 ENCOUNTER — Encounter: Payer: Self-pay | Admitting: Family Medicine

## 2017-10-10 LAB — HM DIABETES EYE EXAM

## 2017-10-31 ENCOUNTER — Ambulatory Visit: Payer: 59 | Admitting: Family Medicine

## 2017-11-07 ENCOUNTER — Encounter: Payer: Self-pay | Admitting: Family Medicine

## 2017-11-07 ENCOUNTER — Ambulatory Visit: Payer: Managed Care, Other (non HMO) | Admitting: Family Medicine

## 2017-11-07 VITALS — BP 143/74 | HR 54 | Wt 231.0 lb

## 2017-11-07 DIAGNOSIS — I10 Essential (primary) hypertension: Secondary | ICD-10-CM | POA: Diagnosis not present

## 2017-11-07 DIAGNOSIS — E119 Type 2 diabetes mellitus without complications: Secondary | ICD-10-CM | POA: Diagnosis not present

## 2017-11-07 DIAGNOSIS — E7800 Pure hypercholesterolemia, unspecified: Secondary | ICD-10-CM | POA: Diagnosis not present

## 2017-11-07 LAB — LP+ALT+AST PICCOLO, WAIVED
ALT (SGPT) Piccolo, Waived: 33 U/L (ref 10–47)
AST (SGOT) Piccolo, Waived: 33 U/L (ref 11–38)
CHOL/HDL RATIO PICCOLO,WAIVE: 2.6 mg/dL
Cholesterol Piccolo, Waived: 149 mg/dL (ref <200)
HDL CHOL PICCOLO, WAIVED: 58 mg/dL (ref >59)
LDL Chol Calc Piccolo Waived: 60 mg/dL (ref <100)
Triglycerides Piccolo,Waived: 152 mg/dL — ABNORMAL HIGH (ref <150)
VLDL CHOL CALC PICCOLO,WAIVE: 30 mg/dL — AB (ref <30)

## 2017-11-07 LAB — BAYER DCA HB A1C WAIVED: HB A1C (BAYER DCA - WAIVED): 5.9 % (ref <7.0)

## 2017-11-07 NOTE — Assessment & Plan Note (Signed)
The current medical regimen is effective;  continue present plan and medications.  

## 2017-11-07 NOTE — Progress Notes (Signed)
   BP (!) 143/74   Pulse (!) 54   Wt 231 lb (104.8 kg)   SpO2 97%   BMI 30.62 kg/m    Subjective:    Patient ID: Alejandro Ryan, male    DOB: [birth year], 67 y.o.   MRN: [id number]  HPI: Alejandro Ryan is a 67 y.o. male  Chief Complaint  Patient presents with  . Follow-up   Patient follow-up diabetes hypertension hypercholesterol is lost 30 pounds by doing weight watchers.  No problems with low blood pressure low blood sugars hypoglycemia etc.  Feeling much better with weight loss. Patient is retiring in 1 week and [place]. Relevant past medical, surgical, family and social history reviewed and updated as indicated. Interim medical history since our last visit reviewed. Allergies and medications reviewed and updated.  Review of Systems  Constitutional: Negative.   HENT: Negative.   Eyes: Negative.   Respiratory: Negative.   Cardiovascular: Negative.   Gastrointestinal: Negative.   Endocrine: Negative.   Genitourinary: Negative.   Musculoskeletal: Negative.   Skin: Negative.   Allergic/Immunologic: Negative.   Neurological: Negative.   Hematological: Negative.   Psychiatric/Behavioral: Negative.     Per HPI unless specifically indicated above     Objective:    BP (!) 143/74   Pulse (!) 54   Wt 231 lb (104.8 kg)   SpO2 97%   BMI 30.62 kg/m   Wt Readings from Last 3 Encounters:  11/07/17 231 lb (104.8 kg)  05/06/17 258 lb (117 kg)  02/25/17 260 lb (117.9 kg)    Physical Exam  Constitutional: He is oriented to person, place, and time. He appears well-developed and well-nourished.  HENT:  Head: Normocephalic and atraumatic.  Eyes: Conjunctivae and EOM are normal.  Neck: Normal range of motion.  Cardiovascular: Normal rate, regular rhythm and normal heart sounds.  Pulmonary/Chest: Effort normal and breath sounds normal.  Musculoskeletal: Normal range of motion.  Neurological: He is alert and oriented to person, place, and time.  Skin: No erythema.    Psychiatric: He has a normal mood and affect. His behavior is normal. Judgment and thought content normal.    Results for orders placed or performed in visit on 10/14/17  HM DIABETES EYE EXAM  Result Value Ref Range   HM Diabetic Eye Exam No Retinopathy No Retinopathy      Assessment & Plan:   Problem List Items Addressed This Visit      Cardiovascular and Mediastinum   Hypertension - Primary    The current medical regimen is effective;  continue present plan and medications.       Relevant Orders   Basic metabolic panel   Bayer DCA Hb N8GA1c Waived   LP+ALT+AST [place]     Endocrine   Diabetes mellitus without complication (HCC)    The current medical regimen is effective;  continue present plan and medications.       Relevant Orders   Basic metabolic panel   Bayer DCA Hb N5AA1c Waived   LP+ALT+AST [place], Waived     Other   Hyperlipidemia    The current medical regimen is effective;  continue present plan and medications.       Relevant Orders   Basic metabolic panel   Bayer DCA Hb O1HA1c Waived   LP+ALT+AST [place], Waived       Follow up plan: Return if symptoms worsen or fail to improve, for Transferring care to [place].

## 2017-11-08 LAB — BASIC METABOLIC PANEL
BUN/Creatinine Ratio: 26 — ABNORMAL HIGH (ref 10–24)
BUN: 22 mg/dL (ref 8–27)
CO2: 25 mmol/L (ref 20–29)
CREATININE: 0.84 mg/dL (ref 0.76–1.27)
Calcium: 9.6 mg/dL (ref 8.6–10.2)
Chloride: 99 mmol/L (ref 96–106)
GFR, EST AFRICAN AMERICAN: 107 mL/min/{1.73_m2} (ref >59)
GFR, EST NON AFRICAN AMERICAN: 93 mL/min/{1.73_m2} (ref >59)
Glucose: 110 mg/dL — ABNORMAL HIGH (ref 65–99)
POTASSIUM: 4.6 mmol/L (ref 3.5–5.2)
SODIUM: 138 mmol/L (ref 134–144)

## 2017-11-11 ENCOUNTER — Encounter: Payer: Self-pay | Admitting: Family Medicine
